# Patient Record
Sex: Female | Born: 1985 | Hispanic: Yes | Marital: Single | Smoking: Never smoker
Health system: Southern US, Community
[De-identification: ages and names within clinical notes are randomized; demographics above are authoritative.]

## PROBLEM LIST (undated history)

## (undated) DIAGNOSIS — Z8619 Personal history of other infectious and parasitic diseases: Secondary | ICD-10-CM

## (undated) HISTORY — DX: Personal history of other infectious and parasitic diseases: Z86.19

---

## 2011-12-09 ENCOUNTER — Other Ambulatory Visit: Payer: Self-pay

## 2011-12-09 LAB — OB RESULTS CONSOLE ANTIBODY SCREEN: Antibody Screen: NEGATIVE

## 2011-12-09 LAB — OB RESULTS CONSOLE GC/CHLAMYDIA
Chlamydia: NEGATIVE
Gonorrhea: NEGATIVE

## 2012-01-06 ENCOUNTER — Other Ambulatory Visit (HOSPITAL_COMMUNITY): Payer: Self-pay | Admitting: Physician Assistant

## 2012-01-06 DIAGNOSIS — Z3689 Encounter for other specified antenatal screening: Secondary | ICD-10-CM

## 2012-01-27 ENCOUNTER — Ambulatory Visit (HOSPITAL_COMMUNITY)
Admission: RE | Admit: 2012-01-27 | Discharge: 2012-01-27 | Disposition: A | Discharge status: Home / Self Care | Payer: Self-pay | Source: Ambulatory Visit | Attending: Physician Assistant | Admitting: Physician Assistant

## 2012-01-27 DIAGNOSIS — Z1389 Encounter for screening for other disorder: Secondary | ICD-10-CM | POA: Insufficient documentation

## 2012-01-27 DIAGNOSIS — O358XX0 Maternal care for other (suspected) fetal abnormality and damage, not applicable or unspecified: Secondary | ICD-10-CM | POA: Insufficient documentation

## 2012-01-27 DIAGNOSIS — Z363 Encounter for antenatal screening for malformations: Secondary | ICD-10-CM | POA: Insufficient documentation

## 2012-01-27 DIAGNOSIS — Z3689 Encounter for other specified antenatal screening: Secondary | ICD-10-CM

## 2012-02-03 ENCOUNTER — Other Ambulatory Visit (HOSPITAL_COMMUNITY): Payer: Self-pay | Admitting: Physician Assistant

## 2012-02-03 DIAGNOSIS — Z3689 Encounter for other specified antenatal screening: Secondary | ICD-10-CM

## 2012-02-04 ENCOUNTER — Ambulatory Visit (HOSPITAL_COMMUNITY)
Admission: RE | Admit: 2012-02-04 | Discharge: 2012-02-04 | Disposition: A | Discharge status: Home / Self Care | Payer: Self-pay | Source: Ambulatory Visit | Attending: Physician Assistant | Admitting: Physician Assistant

## 2012-02-04 ENCOUNTER — Encounter (HOSPITAL_COMMUNITY): Payer: Self-pay

## 2012-02-04 DIAGNOSIS — IMO0002 Reserved for concepts with insufficient information to code with codable children: Secondary | ICD-10-CM | POA: Insufficient documentation

## 2012-02-05 ENCOUNTER — Other Ambulatory Visit: Payer: Self-pay

## 2012-02-05 NOTE — Progress Notes (Signed)
Genetic Counseling  High-Risk Gestation Note  Appointment Date:  02/04/2012 Referred By: Levi Aland, PA Date of Birth:  15-Oct-1985    Pregnancy History: G2P0010 Estimated Date of Delivery: 06/29/2012 Estimated Gestational Age: [redacted]w[redacted]d Attending: Alpha Gula, MD    Ms. Gabriela Willis was seen for genetic counseling regarding an increased risk for Down syndrome based on Quad screen performed through [facility]. [employer] Interpreter, Byrd Hesselbach, provided Spanish/English interpretation during today's visit.   She was counseled regarding the Quad screen result and the associated 1 in 208 risk for fetal Down syndrome.  We reviewed chromosomes, nondisjunction, and the common features and variable prognosis of Down syndrome.  In addition, we reviewed the screen adjusted reduction in risks for trisomy 18 (1 in 1,379 to 1 in 8,334) and ONTDs.  We also discussed other explanations for a screen positive result including: a gestational dating error, differences in maternal metabolism, and normal variation. She understands that this screening is not diagnostic for Down syndrome but provides a risk assessment.  We reviewed other available screening options including noninvasive prenatal testing (NIPT) and detailed ultrasound.  Specifically, we discussed that NIPT analyzes cell free fetal DNA found in the maternal circulation. This test is not diagnostic for chromosome conditions, but can provide information regarding the presence or absence of extra fetal DNA for chromosomes 13, 18, 21, X, and Y, and missing fetal DNA for chromosome X and Y (Turner syndrome). Thus, it would not identify or rule out all genetic conditions. The reported detection rate is greater than 99% for Trisomy 21, greater than 98% for Trisomy 18, and is approximately 80% (8 out of 10) for Trisomy 13. The false positive rate is reported to be less than 0.1% for any of these conditions.  In addition, we  discussed that ~50-80% of fetuses with Down syndrome and up to 90-95% of fetuses with trisomy 18/13, when well visualized, have detectable anomalies or soft markers by detailed ultrasound (~18+ weeks gestation). Ms. Gabriela Willis previously had ultrasound performed through [facility] on 01/27/12. We reviewed that the visualized fetal anatomy was reported to be normal from the previous scan. However, fetal anatomy was suboptimally visualized. A follow-up ultrasound is planned in [facility] on 02/18/12.   Ms. Gabriela Willis was also counseled regarding diagnostic testing via amniocentesis.  We reviewed the approximate 1 in 300-500 risk for complications, including spontaneous pregnancy loss. After consideration of all the options, and a clear understanding of the newness and limitations of NIPT, she elected to proceed with cell free fetal DNA testing (MaterniT21) at the time of today's visit. Those results will be available in 8-10 days.  She declined amniocentesis in pregnancy given the associated risk of complications. Ms. Gabriela Willis stated that she wanted to pursue NIPT in order to better prepare during the pregnancy.   Ms. Gabriela Willis was provided with written information regarding sickle cell anemia (SCA) including the carrier frequency and incidence in the Hispanic population, the availability of carrier testing and prenatal diagnosis if indicated.  In addition, we discussed that hemoglobinopathies are routinely screened for as part of the [place] newborn screening panel.  She previously had a normal hemoglobinopathy profile performed through [facility], indicating that she does not likely carry sickle cell trait.    Both family histories were reviewed and found to be noncontributory for birth defects, mental retardation, and known genetic conditions. Without further information regarding the provided family history,  an accurate genetic risk cannot be calculated. Further genetic counseling is warranted if more information is obtained.  Ms. Gabriela Willis denied exposure to environmental toxins or chemical agents. She denied the use of alcohol, tobacco or street drugs. She denied significant viral illnesses during the course of her pregnancy. Her medical and surgical histories were noncontributory.     I counseled Ms. Gabriela Willis regarding the above risks and available options.  The approximate face-to-face time with the genetic counselor was 40 minutes.    Quinn Plowman, MS,  Certified Genetic Counselor 02/05/2012

## 2012-02-16 ENCOUNTER — Telehealth (HOSPITAL_COMMUNITY): Payer: Self-pay | Admitting: MS"

## 2012-02-16 NOTE — Telephone Encounter (Signed)
[employer], Interpreters [phone] provided interpretation during previous telephone call regarding cell free fetal DNA testing results.

## 2012-02-16 NOTE — Telephone Encounter (Signed)
Attempted to call patient regarding results of cell free fetal DNA testing (MaterniT21). Left message via [facility] [phone], calling with "good news." Asked patient to return call.   Clydie Braun Brylee Berk 02/16/2012 10:15 AM

## 2012-02-16 NOTE — Telephone Encounter (Signed)
Called Gabriela Willis to discuss her MaterniT21, cell free fetal DNA testing. Testing was offered because of screen positive Down syndrome risk. We reviewed that these are within normal limits/negative, for trisomies 21, 18 and 13. We reviewed that this testing identifies > 99% of pregnancies with trisomy 21, >98% of pregnancies with trisomy 13, and >80% with trisomy 54; the false positive rate is <0.1% for all conditions. Testing was also performed for X and Y chromosome analysis. This did not show evidence of aneuploidy for X or Y. It was also consistent with female gender. X and Y analysis has a detection rate of approximately 99%. She understands that this testing does not identify all genetic conditions. All questions were answered to her satisfaction, she was encouraged to call with additional questions or concerns.  Quinn Plowman, MS Certified Genetic Counselor 02/16/2012 3:05 PM

## 2012-02-18 ENCOUNTER — Other Ambulatory Visit (HOSPITAL_COMMUNITY): Payer: Self-pay

## 2012-02-18 ENCOUNTER — Encounter (HOSPITAL_COMMUNITY): Payer: Self-pay

## 2012-02-18 ENCOUNTER — Ambulatory Visit (HOSPITAL_COMMUNITY)
Admission: RE | Admit: 2012-02-18 | Discharge: 2012-02-18 | Disposition: A | Discharge status: Home / Self Care | Payer: Self-pay | Source: Ambulatory Visit | Attending: Physician Assistant | Admitting: Physician Assistant

## 2012-02-18 DIAGNOSIS — Z3689 Encounter for other specified antenatal screening: Secondary | ICD-10-CM | POA: Insufficient documentation

## 2012-02-18 NOTE — Progress Notes (Signed)
Gabriela Willis  was seen today for an ultrasound appointment.  See full report in [facility].  Impression: Single IUP at 21 1/7 weeks Normal detailed fetal anatomy No markers associated with aneuploidy noted Normal amniotic fluid volume  NIPT (cell free fetal DNA) - low risk for aneuploidy  Recommendations: Follow-up ultrasounds as clinically indicated.   Alpha Gula, MD

## 2012-06-30 ENCOUNTER — Other Ambulatory Visit (HOSPITAL_COMMUNITY): Payer: Self-pay | Admitting: Nurse Practitioner

## 2012-06-30 ENCOUNTER — Telehealth (HOSPITAL_COMMUNITY): Payer: Self-pay | Admitting: *Deleted

## 2012-06-30 ENCOUNTER — Encounter (HOSPITAL_COMMUNITY): Payer: Self-pay | Admitting: *Deleted

## 2012-06-30 DIAGNOSIS — O480 Post-term pregnancy: Secondary | ICD-10-CM

## 2012-06-30 NOTE — Telephone Encounter (Signed)
Preadmission screen Interpreter number [phone]

## 2012-07-01 ENCOUNTER — Encounter (HOSPITAL_COMMUNITY): Payer: Self-pay

## 2012-07-01 ENCOUNTER — Inpatient Hospital Stay (HOSPITAL_COMMUNITY)
Admission: AD | Admit: 2012-07-01 | Discharge: 2012-07-01 | Disposition: A | Discharge status: Home / Self Care | Payer: Self-pay | Source: Ambulatory Visit | Attending: Family Medicine | Admitting: Family Medicine

## 2012-07-01 DIAGNOSIS — O479 False labor, unspecified: Secondary | ICD-10-CM | POA: Insufficient documentation

## 2012-07-01 DIAGNOSIS — O99891 Other specified diseases and conditions complicating pregnancy: Secondary | ICD-10-CM | POA: Insufficient documentation

## 2012-07-01 NOTE — MAU Provider Note (Signed)
Chart reviewed and agree with management and plan.  

## 2012-07-01 NOTE — MAU Note (Signed)
Water broke at 4:25am clear fluid. Contractions every 2 minutes. Cervix was 1 cm yesterday.

## 2012-07-01 NOTE — MAU Provider Note (Signed)
First Provider Initiated Contact with Patient 07/01/12 0543      Chief Complaint:  Rupture of Membranes   Gabriela Willis is  27 y.o. G2P0010 at [redacted]w[redacted]d presents complaining of Rupture of Membranes .She had some leaking around 0445.  She has been having contractions since yesterday, now getting closer together  She states irregular, every 3-5 minutes contractions are associated with none vaginal bleeding, {along with active fetal movement.   Obstetrical/Gynecological History: Menstrual History: OB History   Grav Para Term Preterm Abortions TAB SAB Ect Mult Living   2 0 0 0 1 0 1 0 0 0        Patient's last menstrual period was 10/01/2011.     Past Medical History: Past Medical History  Diagnosis Date  . Hx of varicella     Past Surgical History: History reviewed. No pertinent past surgical history.  Family History: Family History  Problem Relation Age of Onset  . Asthma Brother     Social History: History  Substance Use Topics  . Smoking status: Not on file  . Smokeless tobacco: Not on file  . Alcohol Use: Not on file    Allergies:  Allergies  Allergen Reactions  . Penicillins     Meds:  No prescriptions prior to admission    Review of Systems   Constitutional: Negative for fever and chills Eyes: Negative for visual disturbances Respiratory: Negative for shortness of breath, dyspnea Cardiovascular: Negative for chest pain or palpitations  Gastrointestinal: Negative for vomiting, diarrhea and constipation Genitourinary: Negative for dysuria and urgency Musculoskeletal: Negative for back pain, joint pain, myalgias  Neurological: Negative for dizziness and headaches     Physical Exam  Blood pressure 113/73, pulse 74, temperature 98.5 F (36.9 C), temperature source Oral, resp. rate 18, height 4\' 10"  (1.473 m), weight 150 lb (68.04 kg), last menstrual period 10/01/2011, SpO2 100.00%. GENERAL: Well-developed, well-nourished female in no acute  distress.  LUNGS: Clear to auscultation bilaterally.  HEART: Regular rate and rhythm. ABDOMEN: Soft, nontender, nondistended, gravid.  EXTREMITIES: Nontender, no edema, 2+ distal pulses. CERVICAL EXAM: Dilatation 0.5cm   Effacement 90%   Station -1.  Pt states her cx was 1 cm yesterday   Presentation: cephalic FHT:  Baseline rate 150 bpm   Variability moderate  Accelerations present   Decelerations none Contractions: Every 3-5 mins, mild   Labs: Results for orders placed during the hospital encounter of 07/01/12 (from the past 24 hour(s))  WET PREP, GENITAL   Collection Time    07/01/12  5:44 AM      Result Value Range   Yeast Wet Prep HPF POC NONE SEEN  NONE SEEN   Trich, Wet Prep NONE SEEN  NONE SEEN   Clue Cells Wet Prep HPF POC NONE SEEN  NONE SEEN   WBC, Wet Prep HPF POC MODERATE (*) NONE SEEN  AMNISURE RUPTURE OF MEMBRANE (ROM)   Collection Time    07/01/12  5:44 AM      Result Value Range   Amnisure ROM NEGATIVE     Imaging Studies:  No results found.  Assessment: Gabriela Willis is  27 y.o. G2P0010 at [redacted]w[redacted]d presents with no ROM, possibly early labor.  Plan: Instructed to come back if/when contractions increase in strength  CRESENZO-DISHMAN,Zamarah Ullmer 6/13/20146:49 AM

## 2012-07-02 ENCOUNTER — Inpatient Hospital Stay (HOSPITAL_COMMUNITY)
Admission: AD | Admit: 2012-07-02 | Discharge: 2012-07-05 | DRG: 765 | Disposition: A | Discharge status: Home / Self Care | Payer: Medicaid Other | Source: Ambulatory Visit | Attending: Obstetrics and Gynecology | Admitting: Obstetrics and Gynecology

## 2012-07-02 ENCOUNTER — Encounter (HOSPITAL_COMMUNITY): Payer: Self-pay | Admitting: *Deleted

## 2012-07-02 ENCOUNTER — Inpatient Hospital Stay (HOSPITAL_COMMUNITY): Payer: Medicaid Other | Admitting: Anesthesiology

## 2012-07-02 ENCOUNTER — Encounter (HOSPITAL_COMMUNITY): Payer: Self-pay | Admitting: Anesthesiology

## 2012-07-02 DIAGNOSIS — O411090 Infection of amniotic sac and membranes, unspecified, unspecified trimester, not applicable or unspecified: Secondary | ICD-10-CM | POA: Diagnosis present

## 2012-07-02 DIAGNOSIS — O4290 Premature rupture of membranes, unspecified as to length of time between rupture and onset of labor, unspecified weeks of gestation: Principal | ICD-10-CM | POA: Diagnosis present

## 2012-07-02 DIAGNOSIS — Z98891 History of uterine scar from previous surgery: Secondary | ICD-10-CM

## 2012-07-02 LAB — CBC
HCT: 41.4 % (ref 36.0–46.0)
MCHC: 34.5 g/dL (ref 30.0–36.0)
Platelets: 179 10*3/uL (ref 150–400)
RDW: 15 % (ref 11.5–15.5)

## 2012-07-02 LAB — TYPE AND SCREEN: Antibody Screen: NEGATIVE

## 2012-07-02 MED ORDER — EPHEDRINE 5 MG/ML INJ
10.0000 mg | INTRAVENOUS | Status: DC | PRN
  Filled 2012-07-02: qty 4

## 2012-07-02 MED ORDER — PHENYLEPHRINE 40 MCG/ML (10ML) SYRINGE FOR IV PUSH (FOR BLOOD PRESSURE SUPPORT)
80.0000 ug | PREFILLED_SYRINGE | INTRAVENOUS | Status: DC | PRN
  Filled 2012-07-02: qty 5

## 2012-07-02 MED ORDER — PHENYLEPHRINE 40 MCG/ML (10ML) SYRINGE FOR IV PUSH (FOR BLOOD PRESSURE SUPPORT)
80.0000 ug | PREFILLED_SYRINGE | INTRAVENOUS | Status: DC | PRN
  Administered 2012-07-03 (×2): 80 ug via INTRAVENOUS

## 2012-07-02 MED ORDER — OXYTOCIN 40 UNITS IN LACTATED RINGERS INFUSION - SIMPLE MED
62.5000 mL/h | INTRAVENOUS | Status: DC
  Filled 2012-07-02: qty 1000

## 2012-07-02 MED ORDER — LACTATED RINGERS IV SOLN
500.0000 mL | Freq: Once | INTRAVENOUS | Status: AC
  Administered 2012-07-02: 500 mL via INTRAVENOUS

## 2012-07-02 MED ORDER — LACTATED RINGERS IV SOLN
INTRAVENOUS | Status: DC
  Administered 2012-07-02 – 2012-07-03 (×8): via INTRAVENOUS

## 2012-07-02 MED ORDER — FENTANYL 2.5 MCG/ML BUPIVACAINE 1/10 % EPIDURAL INFUSION (WH - ANES)
14.0000 mL/h | INTRAMUSCULAR | Status: DC | PRN
  Administered 2012-07-02: 12 mL/h via EPIDURAL
  Administered 2012-07-03: 14 mL/h via EPIDURAL
  Filled 2012-07-02 (×2): qty 125

## 2012-07-02 MED ORDER — OXYTOCIN 40 UNITS IN LACTATED RINGERS INFUSION - SIMPLE MED
1.0000 m[IU]/min | INTRAVENOUS | Status: DC
  Administered 2012-07-02: 2 m[IU]/min via INTRAVENOUS

## 2012-07-02 MED ORDER — OXYTOCIN BOLUS FROM INFUSION: 500.0000 mL | INTRAVENOUS | Status: DC

## 2012-07-02 MED ORDER — OXYCODONE-ACETAMINOPHEN 5-325 MG PO TABS: 1.0000 | ORAL_TABLET | ORAL | Status: DC | PRN

## 2012-07-02 MED ORDER — DIPHENHYDRAMINE HCL 50 MG/ML IJ SOLN: 12.5000 mg | INTRAMUSCULAR | Status: DC | PRN

## 2012-07-02 MED ORDER — CITRIC ACID-SODIUM CITRATE 334-500 MG/5ML PO SOLN
30.0000 mL | ORAL | Status: DC | PRN
  Administered 2012-07-03: 30 mL via ORAL
  Filled 2012-07-02: qty 15

## 2012-07-02 MED ORDER — TERBUTALINE SULFATE 1 MG/ML IJ SOLN
0.2500 mg | Freq: Once | INTRAMUSCULAR | Status: AC | PRN
  Filled 2012-07-02: qty 1

## 2012-07-02 MED ORDER — FLEET ENEMA 7-19 GM/118ML RE ENEM: 1.0000 | ENEMA | RECTAL | Status: DC | PRN

## 2012-07-02 MED ORDER — ONDANSETRON HCL 4 MG/2ML IJ SOLN: 4.0000 mg | Freq: Four times a day (QID) | INTRAMUSCULAR | Status: DC | PRN

## 2012-07-02 MED ORDER — LIDOCAINE HCL (PF) 1 % IJ SOLN
INTRAMUSCULAR | Status: DC | PRN
  Administered 2012-07-02 (×3): 4 mL

## 2012-07-02 MED ORDER — LIDOCAINE HCL (PF) 1 % IJ SOLN
30.0000 mL | INTRAMUSCULAR | Status: DC | PRN
  Filled 2012-07-02: qty 30

## 2012-07-02 MED ORDER — LACTATED RINGERS IV SOLN
500.0000 mL | INTRAVENOUS | Status: DC | PRN
  Administered 2012-07-03 (×2): 500 mL via INTRAVENOUS

## 2012-07-02 MED ORDER — EPHEDRINE 5 MG/ML INJ: 10.0000 mg | INTRAVENOUS | Status: DC | PRN

## 2012-07-02 MED ORDER — IBUPROFEN 600 MG PO TABS: 600.0000 mg | ORAL_TABLET | Freq: Four times a day (QID) | ORAL | Status: DC | PRN

## 2012-07-02 MED ORDER — ACETAMINOPHEN 325 MG PO TABS: 650.0000 mg | ORAL_TABLET | ORAL | Status: DC | PRN

## 2012-07-02 NOTE — H&P (Signed)
Gabriela Willis is a 27 y.o.G2P0010  female at [redacted]w[redacted]d by 11wk u/s, who was initially a labor check that was being evaluated on L&D, after 2 SVE w/o any cervical change, I came to evaluate pt and she then reported lof since 1200 today.  Small amount of fluid on inner thigh, fern indeterminate, amnisure pos. Reports worseing uc's and lof since yest am- she was evaluated in MAU and had neg amnisure and was d/c'd. Good fm. Denies vb. Initiated pnc at [facility] at 11wks. AFP increased DSR 1:208 w/ negative Materni T21, 1hr glucola 88, normal anatomy u/s, gbs neg.   Maternal Medical History:  Reason for admission: Rupture of membranes and contractions.   Contractions: Onset was 13-24 hours ago.   Frequency: regular.   Perceived severity is moderate.    Fetal activity: Perceived fetal activity is normal.   Last perceived fetal movement was within the past hour.    Prenatal complications: no prenatal complications Prenatal Complications - Diabetes: none.    OB History   Grav Para Term Preterm Abortions TAB SAB Ect Mult Living   2 0 0 0 1 0 1 0 0 0      Past Medical History  Diagnosis Date  . Hx of varicella    History reviewed. No pertinent past surgical history. Family History: family history includes Asthma in her brother. Social History:  does not have a smoking history on file. She has never used smokeless tobacco. She reports that she does not drink alcohol or use illicit drugs.  Review of Systems  Constitutional: Negative.   HENT: Negative.   Eyes: Negative.   Respiratory: Negative.   Cardiovascular: Negative.   Gastrointestinal: Positive for abdominal pain (uc's).  Genitourinary: Negative.   Musculoskeletal: Negative.   Skin: Negative.   Neurological: Negative.   Endo/Heme/Allergies: Negative.   Psychiatric/Behavioral: Negative.     Dilation: 1 Effacement (%): 90 Station: -1 Exam by:: k. Saydie Gerdts, cnm Blood pressure 121/68, pulse 83, temperature 98 F (36.7 C),  temperature source Oral, resp. rate 18, height 4\' 10"  (1.473 m), weight 68.04 kg (150 lb), last menstrual period 10/01/2011. Maternal Exam:  Uterine Assessment: Contraction strength is moderate.  Contraction frequency is regular.   Abdomen: Patient reports no abdominal tenderness. Fetal presentation: vertex  Introitus: Normal vulva. Normal vagina.  Ferning test: positive.  Amniotic fluid character: clear.  Pelvis: adequate for delivery.   Cervix: Cervix evaluated by digital exam.     Fetal Exam Fetal Monitor Review: Mode: ultrasound.   Baseline rate: 140.  Variability: moderate (6-25 bpm).   Pattern: accelerations present and no decelerations.    Fetal State Assessment: Category I - tracings are normal.     Physical Exam  Constitutional: She is oriented to person, place, and time. She appears well-developed and well-nourished.  HENT:  Head: Normocephalic.  Neck: Normal range of motion.  Cardiovascular: Normal rate and regular rhythm.   Respiratory: Effort normal and breath sounds normal.  GI: Soft.  gravid  Genitourinary: Vagina normal and uterus normal.  Small amount of fluid on inner thigh- fern indeterminate Amnisure pos  SVE: 1/90/-1, vtx  Musculoskeletal: Normal range of motion.  Neurological: She is alert and oriented to person, place, and time. She has normal reflexes.  Skin: Skin is warm and dry.  Psychiatric: She has a normal mood and affect. Her behavior is normal. Judgment and thought content normal.    2037: Cervical foley bulb inserted and inflated w/ 60ml LR w/o difficulty, pt has forebag, will arom  if needed after foley bulb falls out  Prenatal labs: ABO, Rh: O/Positive/-- (11/20 0000) Antibody: Negative (11/20 0000) Rubella: Immune (11/20 0000) RPR: Nonreactive (11/20 0000)  HBsAg: Negative (11/20 0000)  HIV: Non-reactive (11/20 0000)  GBS: Negative (05/17 0000)   Assessment/Plan: A:  109w3d SIUP  G2P0010   Cat I FHR  GBS neg  SROM @ 1200 w/  early labor  AFP increased DSR w/ neg Materni T21  P:  Admit to BS  IV pain meds/epidural prn  Cervical foley bulb in place, pitocin per protocol once it falls out  Anticipate NSVD  Marge Duncans 07/02/2012, 7:52 PM

## 2012-07-02 NOTE — Anesthesia Preprocedure Evaluation (Addendum)
Anesthesia Evaluation  Patient identified by MRN, date of birth, ID band Patient awake    Reviewed: Allergy & Precautions, H&P , NPO status , Patient's Chart, lab work & pertinent test results, reviewed documented beta blocker date and time   History of Anesthesia Complications Negative for: history of anesthetic complications  Airway Mallampati: III TM Distance: >3 FB Neck ROM: full    Dental  (+) Teeth Intact   Pulmonary neg pulmonary ROS,  breath sounds clear to auscultation        Cardiovascular negative cardio ROS  Rhythm:regular Rate:Normal     Neuro/Psych negative neurological ROS  negative psych ROS   GI/Hepatic negative GI ROS, Neg liver ROS,   Endo/Other  negative endocrine ROS  Renal/GU negative Renal ROS     Musculoskeletal   Abdominal   Peds  Hematology negative hematology ROS (+)   Anesthesia Other Findings NOTE pt is 4'10" tall  Reproductive/Obstetrics (+) Pregnancy                          Anesthesia Physical Anesthesia Plan  ASA: II  Anesthesia Plan: Epidural   Post-op Pain Management:    Induction:   Airway Management Planned:   Additional Equipment:   Intra-op Plan:   Post-operative Plan:   Informed Consent: I have reviewed the patients History and Physical, chart, labs and discussed the procedure including the risks, benefits and alternatives for the proposed anesthesia with the patient or authorized representative who has indicated his/her understanding and acceptance.     Plan Discussed with:   Anesthesia Plan Comments:         Anesthesia Quick Evaluation

## 2012-07-02 NOTE — Anesthesia Procedure Notes (Signed)
Epidural Patient location during procedure: OB Start time: 07/02/2012 10:35 PM  Staffing Performed by: anesthesiologist   Preanesthetic Checklist Completed: patient identified, site marked, surgical consent, pre-op evaluation, timeout performed, IV checked, risks and benefits discussed and monitors and equipment checked  Epidural Patient position: sitting Prep: site prepped and draped and DuraPrep Patient monitoring: continuous pulse ox and blood pressure Approach: midline Injection technique: LOR air  Needle:  Needle type: Tuohy  Needle gauge: 17 G Needle length: 9 cm and 9 Needle insertion depth: 4 cm Catheter type: closed end flexible Catheter size: 19 Gauge Catheter at skin depth: 9 cm Test dose: negative  Assessment Events: blood not aspirated, injection not painful, no injection resistance, negative IV test and no paresthesia  Additional Notes Discussed risk of headache, infection, bleeding, nerve injury and failed or incomplete block.  Patient voices understanding and wishes to proceed.  Epidural placed easily on first attempt.  No paresthesia.  Patient tolerated procedure well with no apparent complications.  Jasmine December, MDReason for block:procedure for pain

## 2012-07-02 NOTE — Progress Notes (Signed)
Gabriela Willis is a 27 y.o. G2P0010 at [redacted]w[redacted]d admitted for PROM  Subjective: Pt has just received epidural, now comfortable  Objective: BP 97/51  Pulse 86  Temp(Src) 98.7 F (37.1 C) (Oral)  Resp 18  Ht 4\' 10"  (1.473 m)  Wt 68.04 kg (150 lb)  BMI 31.36 kg/m2  SpO2 100%  LMP 10/01/2011      FHT:  FHR: 150 bpm, variability: moderate,  accelerations:  Present,  decelerations:  Absent UC:   regular, every 2-5 minutes SVE:   Dilation: 5.5 Effacement (%): 90 Station: -1 Exam by:: Lilli Few, RN Foley bulb fell out  Labs: Lab Results  Component Value Date   WBC 13.1* 07/02/2012   HGB 14.3 07/02/2012   HCT 41.4 07/02/2012   MCV 87.0 07/02/2012   PLT 179 07/02/2012    Assessment / Plan: S/P foley bulb, will begin pitocin per protocol to achieve adequate labor/dilation  Labor: not yet Preeclampsia:  n/a Fetal Wellbeing:  Category I Pain Control:  Epidural I/D:  n/a Anticipated MOD:  NSVD  Marge Duncans 07/02/2012, 11:27 PM

## 2012-07-03 ENCOUNTER — Encounter (HOSPITAL_COMMUNITY): Payer: Self-pay | Admitting: *Deleted

## 2012-07-03 ENCOUNTER — Encounter (HOSPITAL_COMMUNITY): Payer: Self-pay | Admitting: Certified Registered"

## 2012-07-03 ENCOUNTER — Encounter (HOSPITAL_COMMUNITY)
Admission: AD | Disposition: A | Discharge status: Home / Self Care | Payer: Self-pay | Source: Ambulatory Visit | Attending: Obstetrics and Gynecology

## 2012-07-03 ENCOUNTER — Inpatient Hospital Stay (HOSPITAL_COMMUNITY): Payer: Medicaid Other | Admitting: Certified Registered"

## 2012-07-03 DIAGNOSIS — Z98891 History of uterine scar from previous surgery: Secondary | ICD-10-CM

## 2012-07-03 DIAGNOSIS — O4290 Premature rupture of membranes, unspecified as to length of time between rupture and onset of labor, unspecified weeks of gestation: Secondary | ICD-10-CM

## 2012-07-03 DIAGNOSIS — O411090 Infection of amniotic sac and membranes, unspecified, unspecified trimester, not applicable or unspecified: Secondary | ICD-10-CM

## 2012-07-03 SURGERY — Surgical Case
Anesthesia: Epidural | Site: Abdomen | Wound class: Clean Contaminated

## 2012-07-03 MED ORDER — LANOLIN HYDROUS EX OINT: 1.0000 "application " | TOPICAL_OINTMENT | CUTANEOUS | Status: DC | PRN

## 2012-07-03 MED ORDER — SODIUM BICARBONATE 8.4 % IV SOLN
INTRAVENOUS | Status: DC | PRN
  Administered 2012-07-03: 4 mL via EPIDURAL

## 2012-07-03 MED ORDER — DIBUCAINE 1 % RE OINT: 1.0000 "application " | TOPICAL_OINTMENT | RECTAL | Status: DC | PRN

## 2012-07-03 MED ORDER — 0.9 % SODIUM CHLORIDE (POUR BTL) OPTIME
TOPICAL | Status: DC | PRN
  Administered 2012-07-03: 1000 mL

## 2012-07-03 MED ORDER — OXYTOCIN 10 UNIT/ML IJ SOLN
INTRAMUSCULAR | Status: AC
  Filled 2012-07-03: qty 5

## 2012-07-03 MED ORDER — LIDOCAINE-EPINEPHRINE (PF) 2 %-1:200000 IJ SOLN
INTRAMUSCULAR | Status: AC
  Filled 2012-07-03: qty 20

## 2012-07-03 MED ORDER — DIPHENHYDRAMINE HCL 25 MG PO CAPS: 25.0000 mg | ORAL_CAPSULE | Freq: Four times a day (QID) | ORAL | Status: DC | PRN

## 2012-07-03 MED ORDER — SODIUM CHLORIDE 0.9 % IJ SOLN: 3.0000 mL | INTRAMUSCULAR | Status: DC | PRN

## 2012-07-03 MED ORDER — OXYTOCIN 10 UNIT/ML IJ SOLN
40.0000 [IU] | INTRAVENOUS | Status: DC | PRN
  Administered 2012-07-03: 40 [IU] via INTRAVENOUS

## 2012-07-03 MED ORDER — ZOLPIDEM TARTRATE 5 MG PO TABS: 5.0000 mg | ORAL_TABLET | Freq: Every evening | ORAL | Status: DC | PRN

## 2012-07-03 MED ORDER — ONDANSETRON HCL 4 MG/2ML IJ SOLN
INTRAMUSCULAR | Status: DC | PRN
  Administered 2012-07-03: 4 mg via INTRAVENOUS

## 2012-07-03 MED ORDER — MEPERIDINE HCL 25 MG/ML IJ SOLN
INTRAMUSCULAR | Status: AC
  Filled 2012-07-03: qty 1

## 2012-07-03 MED ORDER — PRENATAL MULTIVITAMIN CH
1.0000 | ORAL_TABLET | Freq: Every day | ORAL | Status: DC
  Administered 2012-07-04 – 2012-07-05 (×2): 1 via ORAL
  Filled 2012-07-03 (×2): qty 1

## 2012-07-03 MED ORDER — TETANUS-DIPHTH-ACELL PERTUSSIS 5-2.5-18.5 LF-MCG/0.5 IM SUSP
0.5000 mL | Freq: Once | INTRAMUSCULAR | Status: AC
  Administered 2012-07-04: 0.5 mL via INTRAMUSCULAR
  Filled 2012-07-03: qty 0.5

## 2012-07-03 MED ORDER — ONDANSETRON HCL 4 MG/2ML IJ SOLN
INTRAMUSCULAR | Status: AC
  Filled 2012-07-03: qty 2

## 2012-07-03 MED ORDER — MEPERIDINE HCL 25 MG/ML IJ SOLN: 6.2500 mg | INTRAMUSCULAR | Status: DC | PRN

## 2012-07-03 MED ORDER — GENTAMICIN SULFATE 40 MG/ML IJ SOLN
140.0000 mg | Freq: Once | INTRAVENOUS | Status: AC
  Administered 2012-07-03: 140 mg via INTRAVENOUS
  Filled 2012-07-03: qty 3.5

## 2012-07-03 MED ORDER — TERBUTALINE SULFATE 1 MG/ML IJ SOLN
0.2500 mg | Freq: Once | INTRAMUSCULAR | Status: AC
  Administered 2012-07-03: 0.25 mg via SUBCUTANEOUS

## 2012-07-03 MED ORDER — KETOROLAC TROMETHAMINE 30 MG/ML IJ SOLN
INTRAMUSCULAR | Status: AC
  Administered 2012-07-03: 30 mg via INTRAVENOUS
  Filled 2012-07-03: qty 1

## 2012-07-03 MED ORDER — NALOXONE HCL 0.4 MG/ML IJ SOLN: 0.4000 mg | INTRAMUSCULAR | Status: DC | PRN

## 2012-07-03 MED ORDER — SENNOSIDES-DOCUSATE SODIUM 8.6-50 MG PO TABS
2.0000 | ORAL_TABLET | Freq: Every day | ORAL | Status: DC
  Administered 2012-07-03 – 2012-07-04 (×2): 2 via ORAL

## 2012-07-03 MED ORDER — SCOPOLAMINE 1 MG/3DAYS TD PT72
MEDICATED_PATCH | TRANSDERMAL | Status: AC
  Filled 2012-07-03: qty 1

## 2012-07-03 MED ORDER — MAGNESIUM HYDROXIDE 400 MG/5ML PO SUSP: 30.0000 mL | ORAL | Status: DC | PRN

## 2012-07-03 MED ORDER — DEXTROSE 5 % IV SOLN
140.0000 mg | Freq: Three times a day (TID) | INTRAVENOUS | Status: AC
  Administered 2012-07-03 – 2012-07-04 (×3): 140 mg via INTRAVENOUS
  Filled 2012-07-03 (×3): qty 3.5

## 2012-07-03 MED ORDER — KETOROLAC TROMETHAMINE 30 MG/ML IJ SOLN
30.0000 mg | Freq: Four times a day (QID) | INTRAMUSCULAR | Status: DC | PRN
  Administered 2012-07-03: 30 mg via INTRAVENOUS
  Filled 2012-07-03: qty 1

## 2012-07-03 MED ORDER — GENTAMICIN SULFATE 40 MG/ML IJ SOLN
120.0000 mg | Freq: Three times a day (TID) | INTRAVENOUS | Status: DC
  Filled 2012-07-03: qty 3

## 2012-07-03 MED ORDER — MORPHINE SULFATE 0.5 MG/ML IJ SOLN
INTRAMUSCULAR | Status: AC
  Filled 2012-07-03: qty 10

## 2012-07-03 MED ORDER — IBUPROFEN 600 MG PO TABS
600.0000 mg | ORAL_TABLET | Freq: Four times a day (QID) | ORAL | Status: DC
  Administered 2012-07-03 – 2012-07-05 (×7): 600 mg via ORAL
  Filled 2012-07-03 (×7): qty 1

## 2012-07-03 MED ORDER — METOCLOPRAMIDE HCL 5 MG/ML IJ SOLN: 10.0000 mg | Freq: Three times a day (TID) | INTRAMUSCULAR | Status: DC | PRN

## 2012-07-03 MED ORDER — NALBUPHINE HCL 10 MG/ML IJ SOLN
5.0000 mg | INTRAMUSCULAR | Status: DC | PRN
  Filled 2012-07-03: qty 1

## 2012-07-03 MED ORDER — MORPHINE SULFATE (PF) 0.5 MG/ML IJ SOLN
INTRAMUSCULAR | Status: DC | PRN
  Administered 2012-07-03: 3 mg via EPIDURAL

## 2012-07-03 MED ORDER — KETOROLAC TROMETHAMINE 30 MG/ML IJ SOLN: 30.0000 mg | Freq: Four times a day (QID) | INTRAMUSCULAR | Status: DC | PRN

## 2012-07-03 MED ORDER — ONDANSETRON HCL 4 MG/2ML IJ SOLN: 4.0000 mg | Freq: Three times a day (TID) | INTRAMUSCULAR | Status: DC | PRN

## 2012-07-03 MED ORDER — BUPIVACAINE HCL (PF) 0.5 % IJ SOLN
INTRAMUSCULAR | Status: DC | PRN
  Administered 2012-07-03: 30 mL

## 2012-07-03 MED ORDER — SODIUM BICARBONATE 8.4 % IV SOLN
INTRAVENOUS | Status: AC
  Filled 2012-07-03: qty 50

## 2012-07-03 MED ORDER — BUPIVACAINE HCL (PF) 0.5 % IJ SOLN
INTRAMUSCULAR | Status: AC
  Filled 2012-07-03: qty 30

## 2012-07-03 MED ORDER — VANCOMYCIN HCL IN DEXTROSE 1-5 GM/200ML-% IV SOLN
1000.0000 mg | Freq: Two times a day (BID) | INTRAVENOUS | Status: DC
  Administered 2012-07-03: 1000 mg via INTRAVENOUS
  Filled 2012-07-03 (×2): qty 200

## 2012-07-03 MED ORDER — ACETAMINOPHEN 500 MG PO TABS
1000.0000 mg | ORAL_TABLET | Freq: Four times a day (QID) | ORAL | Status: DC | PRN
  Administered 2012-07-03: 1000 mg via ORAL
  Filled 2012-07-03: qty 2

## 2012-07-03 MED ORDER — DIPHENHYDRAMINE HCL 25 MG PO CAPS: 25.0000 mg | ORAL_CAPSULE | ORAL | Status: DC | PRN

## 2012-07-03 MED ORDER — MENTHOL 3 MG MT LOZG: 1.0000 | LOZENGE | OROMUCOSAL | Status: DC | PRN

## 2012-07-03 MED ORDER — OXYTOCIN 40 UNITS IN LACTATED RINGERS INFUSION - SIMPLE MED: 125.0000 mL/h | INTRAVENOUS | Status: AC

## 2012-07-03 MED ORDER — PHENYLEPHRINE 40 MCG/ML (10ML) SYRINGE FOR IV PUSH (FOR BLOOD PRESSURE SUPPORT)
PREFILLED_SYRINGE | INTRAVENOUS | Status: AC
  Filled 2012-07-03: qty 5

## 2012-07-03 MED ORDER — NALOXONE HCL 1 MG/ML IJ SOLN
1.0000 ug/kg/h | INTRAVENOUS | Status: DC | PRN
  Filled 2012-07-03: qty 2

## 2012-07-03 MED ORDER — OXYCODONE-ACETAMINOPHEN 5-325 MG PO TABS
1.0000 | ORAL_TABLET | ORAL | Status: DC | PRN
  Administered 2012-07-04 (×2): 1 via ORAL
  Filled 2012-07-03 (×2): qty 1

## 2012-07-03 MED ORDER — DIPHENHYDRAMINE HCL 50 MG/ML IJ SOLN: 25.0000 mg | INTRAMUSCULAR | Status: DC | PRN

## 2012-07-03 MED ORDER — MEPERIDINE HCL 25 MG/ML IJ SOLN
INTRAMUSCULAR | Status: DC | PRN
  Administered 2012-07-03 (×2): 12.5 mg via INTRAVENOUS

## 2012-07-03 MED ORDER — VANCOMYCIN HCL IN DEXTROSE 1-5 GM/200ML-% IV SOLN
1000.0000 mg | Freq: Two times a day (BID) | INTRAVENOUS | Status: AC
  Administered 2012-07-03 – 2012-07-04 (×2): 1000 mg via INTRAVENOUS
  Filled 2012-07-03 (×2): qty 200

## 2012-07-03 MED ORDER — FENTANYL CITRATE 0.05 MG/ML IJ SOLN: 100.0000 ug | INTRAMUSCULAR | Status: DC | PRN

## 2012-07-03 MED ORDER — ONDANSETRON HCL 4 MG/2ML IJ SOLN
4.0000 mg | INTRAMUSCULAR | Status: DC | PRN
  Administered 2012-07-03: 4 mg via INTRAVENOUS
  Filled 2012-07-03: qty 2

## 2012-07-03 MED ORDER — SIMETHICONE 80 MG PO CHEW
80.0000 mg | CHEWABLE_TABLET | ORAL | Status: DC | PRN
  Administered 2012-07-04: 80 mg via ORAL

## 2012-07-03 MED ORDER — SODIUM BICARBONATE 8.4 % IV SOLN
INTRAVENOUS | Status: DC | PRN
  Administered 2012-07-03: 2 mL via EPIDURAL

## 2012-07-03 MED ORDER — WITCH HAZEL-GLYCERIN EX PADS: 1.0000 "application " | MEDICATED_PAD | CUTANEOUS | Status: DC | PRN

## 2012-07-03 MED ORDER — SCOPOLAMINE 1 MG/3DAYS TD PT72
1.0000 | MEDICATED_PATCH | Freq: Once | TRANSDERMAL | Status: DC
  Administered 2012-07-03: 1.5 mg via TRANSDERMAL

## 2012-07-03 MED ORDER — DIPHENHYDRAMINE HCL 50 MG/ML IJ SOLN: 12.5000 mg | INTRAMUSCULAR | Status: DC | PRN

## 2012-07-03 MED ORDER — ONDANSETRON HCL 4 MG PO TABS: 4.0000 mg | ORAL_TABLET | ORAL | Status: DC | PRN

## 2012-07-03 MED ORDER — FENTANYL CITRATE 0.05 MG/ML IJ SOLN: 25.0000 ug | INTRAMUSCULAR | Status: DC | PRN

## 2012-07-03 SURGICAL SUPPLY — 31 items
CLAMP CORD UMBIL (MISCELLANEOUS) ×2 IMPLANT
CLOTH BEACON ORANGE TIMEOUT ST (SAFETY) ×2 IMPLANT
DRAPE LG THREE QUARTER DISP (DRAPES) ×2 IMPLANT
DRSG OPSITE POSTOP 4X10 (GAUZE/BANDAGES/DRESSINGS) ×2 IMPLANT
DURAPREP 26ML APPLICATOR (WOUND CARE) ×2 IMPLANT
ELECT REM PT RETURN 9FT ADLT (ELECTROSURGICAL) ×2
ELECTRODE REM PT RTRN 9FT ADLT (ELECTROSURGICAL) ×1 IMPLANT
EXTRACTOR VACUUM M CUP 4 TUBE (SUCTIONS) IMPLANT
GLOVE BIO SURGEON STRL SZ7 (GLOVE) ×2 IMPLANT
GLOVE BIOGEL PI IND STRL 7.0 (GLOVE) ×1 IMPLANT
GLOVE BIOGEL PI INDICATOR 7.0 (GLOVE) ×1
GOWN STRL REIN XL XLG (GOWN DISPOSABLE) ×4 IMPLANT
KIT ABG SYR 3ML LUER SLIP (SYRINGE) ×2 IMPLANT
NEEDLE HYPO 22GX1.5 SAFETY (NEEDLE) ×2 IMPLANT
NEEDLE HYPO 25X5/8 SAFETYGLIDE (NEEDLE) ×2 IMPLANT
NS IRRIG 1000ML POUR BTL (IV SOLUTION) ×2 IMPLANT
PACK C SECTION WH (CUSTOM PROCEDURE TRAY) ×2 IMPLANT
PAD ABD 7.5X8 STRL (GAUZE/BANDAGES/DRESSINGS) ×2 IMPLANT
PAD OB MATERNITY 4.3X12.25 (PERSONAL CARE ITEMS) ×2 IMPLANT
RTRCTR C-SECT PINK 25CM LRG (MISCELLANEOUS) ×2 IMPLANT
STAPLER VISISTAT 35W (STAPLE) IMPLANT
SUT PDS AB 0 CT1 27 (SUTURE) IMPLANT
SUT PDS AB 0 CTX 36 PDP370T (SUTURE) IMPLANT
SUT VIC AB 0 CT1 36 (SUTURE) ×4 IMPLANT
SUT VIC AB 0 CTX 36 (SUTURE) ×2
SUT VIC AB 0 CTX36XBRD ANBCTRL (SUTURE) ×2 IMPLANT
SUT VIC AB 4-0 KS 27 (SUTURE) ×2 IMPLANT
SYR 30ML LL (SYRINGE) ×2 IMPLANT
TOWEL OR 17X24 6PK STRL BLUE (TOWEL DISPOSABLE) ×6 IMPLANT
TRAY FOLEY CATH 14FR (SET/KITS/TRAYS/PACK) IMPLANT
WATER STERILE IRR 1000ML POUR (IV SOLUTION) IMPLANT

## 2012-07-03 NOTE — Anesthesia Preprocedure Evaluation (Signed)
Anesthesia Evaluation  Patient identified by MRN, date of birth, ID band Patient awake    Reviewed: Allergy & Precautions, H&P , NPO status , Patient's Chart, lab work & pertinent test results, reviewed documented beta blocker date and time   History of Anesthesia Complications Negative for: history of anesthetic complications  Airway Mallampati: III TM Distance: >3 FB Neck ROM: full    Dental  (+) Teeth Intact   Pulmonary neg pulmonary ROS,  breath sounds clear to auscultation        Cardiovascular negative cardio ROS  Rhythm:regular Rate:Normal     Neuro/Psych negative neurological ROS  negative psych ROS   GI/Hepatic negative GI ROS, Neg liver ROS,   Endo/Other  negative endocrine ROS  Renal/GU negative Renal ROS     Musculoskeletal   Abdominal   Peds  Hematology negative hematology ROS (+)   Anesthesia Other Findings NOTE pt is 4'10" tall  Reproductive/Obstetrics (+) Pregnancy                           Anesthesia Physical  Anesthesia Plan  ASA: II and emergent  Anesthesia Plan: Epidural   Post-op Pain Management:    Induction:   Airway Management Planned:   Additional Equipment:   Intra-op Plan:   Post-operative Plan:   Informed Consent: I have reviewed the patients History and Physical, chart, labs and discussed the procedure including the risks, benefits and alternatives for the proposed anesthesia with the patient or authorized representative who has indicated his/her understanding and acceptance.     Plan Discussed with: CRNA, Anesthesiologist and Surgeon  Anesthesia Plan Comments:         Anesthesia Quick Evaluation

## 2012-07-03 NOTE — Progress Notes (Signed)
Gabriela Willis is a 27 y.o. G2P0010 at [redacted]w[redacted]d admitted for PROM  Subjective: Comfortable with epidural, no complaints.   Objective: BP 102/41  Pulse 107  Temp(Src) 99 F (37.2 C) (Oral)  Resp 20  Ht 4\' 10"  (1.473 m)  Wt 150 lb (68.04 kg)  BMI 31.36 kg/m2  SpO2 98%  LMP 10/01/2011  Total I/O In: -  Out: 350 [Urine:350]  FHT:  FHR: 155 bpm, variability: minimal ,  accelerations:  Abscent,  decelerations:  Absent.  No response to scalp stimulation.  UC:  Every 4 min SVE:   7/100/+1 w/ caput  Labs: Lab Results  Component Value Date   WBC 13.1* 07/02/2012   HGB 14.3 07/02/2012   HCT 41.4 07/02/2012   MCV 87.0 07/02/2012   PLT 179 07/02/2012    Assessment / Plan: IOL 2/2 PROM, pitocin off since 0530 2/2 repetitive lates, now with loss of variability and no response to scalp stimulation all in the setting of chorioamnionitis. Given fetal intolerance of labor and non reassuring FHR pattern, recommended cesarean section.  Using a Spanish interpreter, the risks of cesarean section discussed with the patient included but were not limited to: bleeding which may require transfusion or reoperation; infection which may require antibiotics; injury to bowel, bladder, ureters or other surrounding organs; injury to the fetus; need for additional procedures including hysterectomy in the event of a life-threatening hemorrhage; placental abnormalities wth subsequent pregnancies, incisional problems, thromboembolic phenomenon and other postoperative/anesthesia complications. The patient concurred with the proposed plan, giving informed written consent for the procedure.  Anesthesia and OR aware. She is already on Vancomycin and Gentamicin for chorioamnionitis, this is sufficient for SCIP.  SCDs ordered on call to the OR.  To OR when ready.  Khadija Thier A  MD  07/03/2012, 9:56 AM

## 2012-07-03 NOTE — H&P (Signed)
Attestation of Attending Supervision of Advanced Practitioner (CNM/NP): Evaluation and management procedures were performed by the Advanced Practitioner under my supervision and collaboration.  I have reviewed the Advanced Practitioner's note and chart, and I agree with the management and plan.  Nyesha Cliff 07/03/2012 7:42 AM

## 2012-07-03 NOTE — Progress Notes (Signed)
Gabriela Willis is a 27 y.o. G2P0010 at [redacted]w[redacted]d admitted for PROM  Subjective: Comfortable w/ epidural  Objective: BP 104/61  Pulse 83  Temp(Src) 99.2 F (37.3 C) (Oral)  Resp 16  Ht 4\' 10"  (1.473 m)  Wt 68.04 kg (150 lb)  BMI 31.36 kg/m2  SpO2 100%  LMP 10/01/2011      FHT:  FHR: 155 bpm, variability: moderate,  accelerations:  Abscent,  decelerations:  Absent, had previously had a few lates that have since resolved UC:   regular, every 1-4 minutes SVE:   Dilation: 7 Effacement (%): 100 Station: +1 Exam by:: Lilli Few, RN  Labs: Lab Results  Component Value Date   WBC 13.1* 07/02/2012   HGB 14.3 07/02/2012   HCT 41.4 07/02/2012   MCV 87.0 07/02/2012   PLT 179 07/02/2012    Assessment / Plan: IOL of labor d/t prom, progressing well on pitocin, now @ 46mu/min  Labor: Progressing normally Preeclampsia:  n/a Fetal Wellbeing:  Category I Pain Control:  Epidural I/D:  n/a Anticipated MOD:  NSVD  Marge Duncans 07/03/2012, 3:08 AM

## 2012-07-03 NOTE — Progress Notes (Signed)
ANTIBIOTIC CONSULT NOTE - INITIAL  Pharmacy Consult for gentamicin  Indication: maternal fever - R/O chorioammionitis  Allergies  Allergen Reactions  . Penicillins Hives    Patient Measurements: Height: 4\' 10"  (147.3 cm) Weight: 150 lb (68.04 kg) IBW/kg (Calculated) : 40.9 Adjusted Body Weight: 50 kg  Vital Signs: Temp: 101.6 F (38.7 C) (06/15 0340) Temp src: Oral (06/15 0340) BP: 136/80 mmHg (06/15 0401) Pulse Rate: 96 (06/15 0401) Intake/Output from previous day:   Intake/Output from this shift:    Labs:  Recent Labs  07/02/12 1910  WBC 13.1*  HGB 14.3  PLT 179   CrCl is unknown because no creatinine reading has been taken. Est CrCl for age and pregnancy should be CrCl= 120-118ml/min  Microbiology: Recent Results (from the past 720 hour(s))  OB RESULTS CONSOLE GBS     Status: None   Collection Time    06/04/12 12:00 AM      Result Value Range Status   GBS Negative   Final  WET PREP, GENITAL     Status: Abnormal   Collection Time    07/01/12  5:44 AM      Result Value Range Status   Yeast Wet Prep HPF POC NONE SEEN  NONE SEEN Final   Trich, Wet Prep NONE SEEN  NONE SEEN Final   Clue Cells Wet Prep HPF POC NONE SEEN  NONE SEEN Final   WBC, Wet Prep HPF POC MODERATE (*) NONE SEEN Final   Comment: MODERATE BACTERIA SEEN    Medical History: Past Medical History  Diagnosis Date  . Hx of varicella     Medications:  Scheduled:  . vancomycin  1,000 mg Intravenous Q12H   Infusions:  . fentaNYL 2.5 mcg/ml w/bupivacaine 1/10% in NS epidural infusion ( total) 12 mL/hr (07/02/12 2246)  . lactated ringers 125 mL/hr at 07/03/12 0330  . oxytocin 40 units in LR 1000 mL    . oxytocin 40 units in LR 1000 mL    . oxytocin 40 units in LR 1000 mL 8 milli-units/min (07/03/12 0236)   PRN: acetaminophen, acetaminophen, citric acid-sodium citrate, diphenhydrAMINE, ePHEDrine, ePHEDrine, fentaNYL 2.5 mcg/ml w/bupivacaine 1/10% in NS epidural infusion  ( total), ibuprofen, lactated ringers, lidocaine (PF), ondansetron, oxyCODONE-acetaminophen, phenylephrine, phenylephrine, sodium phosphate  Assessment: 27 yr female with term pregnancy, with sx's of maternal fever, ROM.  Goal of Therapy:  Desire peak gentamicin serum level 6-41mcg/ml & trough level <32mcg/ml.  Note slightly increased risk of nephrotoxicity combine with vancomycin.  Plan:  1.  Loading  Dose Gentamicin 140mg  x 1, then 2.  Maintenance regimen gentamicin 120mg  IV q8h 3. Serum Creatinine to confirm est CrCl if tx continued 4. Measurement of actual serum gentamicin levels if tx >72hr or as clinically indicated  Scarlett Presto 07/03/2012,4:19 AM

## 2012-07-03 NOTE — Preoperative (Signed)
Beta Blockers   Reason not to administer Beta Blockers:Not Applicable 

## 2012-07-03 NOTE — Progress Notes (Signed)
1025 after monitoring FHR in OR, it appeared the wall connection to [facility] had become disconnected and FHR had traced on paper but not on the hard drive. Tracing sent to medical records. (4 minute tracing while pt dosed in OR). FHR 153-155.

## 2012-07-03 NOTE — Anesthesia Postprocedure Evaluation (Signed)
  Anesthesia Post-op Note  Patient: Gabriela Willis  Procedure(s) Performed: Procedure(s) with comments: CESAREAN SECTION (N/A) - Primary Cesarean Section Delivery Baby Girl @ 1042, Apgars 9/9   Patient Location: PACU  Anesthesia Type:Epidural  Level of Consciousness: awake, alert  and oriented  Airway and Oxygen Therapy: Patient Spontanous Breathing  Post-op Pain: none  Post-op Assessment: Post-op Vital signs reviewed, Patient's Cardiovascular Status Stable, Respiratory Function Stable, Patent Airway, No signs of Nausea or vomiting, Pain level controlled, No headache and No backache  Post-op Vital Signs: Reviewed and stable  Complications: No apparent anesthesia complications

## 2012-07-03 NOTE — Progress Notes (Signed)
Gabriela Willis is a 27 y.o. G2P0010 at [redacted]w[redacted]d admitted for PROM  Subjective: Comfortable w/ epidural, no longer shaking, has been drinking ice water, and has had cold wash cloths to axilla, groin, neck as directed  Objective: BP 99/57  Pulse 84  Temp(Src) 99.4 F (37.4 C) (Oral)  Resp 16  Ht 4\' 10"  (1.473 m)  Wt 68.04 kg (150 lb)  BMI 31.36 kg/m2  SpO2 100%  LMP 10/01/2011  99.5 oral, 100.6 axillary    FHT:  FHR: 175 bpm, variability: minimal ,  accelerations:  Abscent,  decelerations:  Present occasional lates- had been repetitive UC:   1-5 SVE:   7/100/+1 w/ caput  Labs: Lab Results  Component Value Date   WBC 13.1* 07/02/2012   HGB 14.3 07/02/2012   HCT 41.4 07/02/2012   MCV 87.0 07/02/2012   PLT 179 07/02/2012    Assessment / Plan: IOL d/t prom, pitocin off since 0530 d/t repetitive lates, now are only occasional- dr. Jolayne Panther reviewed strip, to continue to observe, if lates become repetitive again will try terbutaline Re-wet wash cloths, repositioned pt  Labor: protracted active phase d/t having to d/c pitocin Preeclampsia:  n/a Fetal Wellbeing:  Category II Pain Control:  Epidural I/D:  vanc and gent for chorioamnionitis Anticipated MOD:  NSVD  Gabriela Willis 07/03/2012, 7:31 AM

## 2012-07-03 NOTE — Transfer of Care (Signed)
Immediate Anesthesia Transfer of Care Note  Patient: Gabriela Willis  Procedure(s) Performed: Procedure(s) with comments: CESAREAN SECTION (N/A) - Primary Cesarean Section Delivery Baby Girl @ 1042, Apgars 9/9   Patient Location: PACU  Anesthesia Type:Epidural  Level of Consciousness: awake, alert  and oriented  Airway & Oxygen Therapy: Patient Spontanous Breathing  Post-op Assessment: Report given to PACU RN and Post -op Vital signs reviewed and stable  Post vital signs: Reviewed and stable  Complications: No apparent anesthesia complications

## 2012-07-03 NOTE — Op Note (Signed)
Gabriela Willis PROCEDURE DATE:  07/03/2012  PREOPERATIVE DIAGNOSES: Intrauterine pregnancy at  [redacted]w[redacted]d weeks gestation; fetal intolerance of labor; nonreassuring fetal heart rate tracing; chorioamnionitis  POSTOPERATIVE DIAGNOSES: The same  PROCEDURE: Primary Low Transverse Cesarean Section  SURGEON:  Dr. Jaynie Collins  ANESTHESIOLOGIST: Dr. Mal Amabile  INDICATIONS: Gabriela Willis is a 27 y.o. G2P1011 at [redacted]w[redacted]d here for cesarean section secondary to the indications listed under preoperative diagnoses; please see preoperative note for further details.  The risks of cesarean section were discussed with the patient including but were not limited to: bleeding which may require transfusion or reoperation; infection which may require antibiotics; injury to bowel, bladder, ureters or other surrounding organs; injury to the fetus; need for additional procedures including hysterectomy in the event of a life-threatening hemorrhage; placental abnormalities wth subsequent pregnancies, incisional problems, thromboembolic phenomenon and other postoperative/anesthesia complications.   The patient concurred with the proposed plan, giving informed written consent for the procedure.  Counseling was done with the help of a Spanish interpreter.  FINDINGS:  Viable female infant in cephalic presentation.  Apgars 9 and 9.  Heavy meconium thick amniotic fluid.  Intact placenta, three vessel cord.  Normal uterus, fallopian tubes and ovaries bilaterally.  ANESTHESIA: Epidural INTRAVENOUS FLUIDS: 2200 ml ESTIMATED BLOOD LOSS: 800 ml URINE OUTPUT:  300 ml SPECIMENS: Placenta sent to pathology COMPLICATIONS: None immediate  PROCEDURE IN DETAIL:  The patient preoperatively received intravenous antibiotics and had sequential compression devices applied to her lower extremities.  She was then taken to the operating room where the epidural anesthesia was dosed up to surgical level and was found to be  adequate. She was then placed in a dorsal supine position with a leftward tilt, and prepped and draped in a sterile manner.  A foley catheter was placed into her bladder and attached to constant gravity.  After an adequate timeout was performed, a Pfannenstiel skin incision was made with scalpel and carried through to the underlying layer of fascia. The fascia was incised in the midline, and this incision was extended bilaterally using the Mayo scissors.  Kocher clamps were applied to the superior aspect of the fascial incision and the underlying rectus muscles were dissected off bluntly. A similar process was carried out on the inferior aspect of the fascial incision. The rectus muscles were separated in the midline bluntly and the peritoneum was entered bluntly. Attention was turned to the lower uterine segment where a low transverse hysterotomy was made with a scalpel and extended bilaterally bluntly.  The infant was successfully delivered, the cord was clamped and cut and the infant was handed over to awaiting neonatology team. Uterine massage was then administered, and the placenta delivered intact with a three-vessel cord. The uterus was then cleared of clot and debris.  The hysterotomy was closed with 0 Vicryl in a running locked fashion, and an imbricating layer was also placed with 0 Vicryl. The pelvis was cleared of all clot and debris. Hemostasis was confirmed on all surfaces.  The peritoneum and the muscles were reapproximated using 0 Vicryl interrupted stitches. The fascia was then closed using 0 Vicryl in a running fashion.  The subcutaneous layer was irrigated, and 30 ml of 0.5% Marcaine was injected subcutaneously around the incision.  The skin was closed with a 4-0 Vicryl subcuticular stitch. . The patient tolerated the procedure well. Sponge, lap, instrument and needle counts were correct x 2.  She was taken to the recovery room in stable condition.

## 2012-07-04 ENCOUNTER — Ambulatory Visit (HOSPITAL_COMMUNITY): Payer: Self-pay

## 2012-07-04 ENCOUNTER — Encounter (HOSPITAL_COMMUNITY): Payer: Self-pay | Admitting: Obstetrics & Gynecology

## 2012-07-04 ENCOUNTER — Ambulatory Visit (HOSPITAL_COMMUNITY): Admission: RE | Admit: 2012-07-04 | Payer: Self-pay | Source: Ambulatory Visit

## 2012-07-04 LAB — CBC
MCV: 87.3 fL (ref 78.0–100.0)
Platelets: 122 10*3/uL — ABNORMAL LOW (ref 150–400)
RBC: 3.38 MIL/uL — ABNORMAL LOW (ref 3.87–5.11)
RDW: 15.4 % (ref 11.5–15.5)
WBC: 18.4 10*3/uL — ABNORMAL HIGH (ref 4.0–10.5)

## 2012-07-04 NOTE — Anesthesia Postprocedure Evaluation (Signed)
  Anesthesia Post-op Note  Patient: Gabriela Willis  Procedure(s) Performed: * No procedures listed *  Patient Location: Mother/Baby  Anesthesia Type:Epidural  Level of Consciousness: awake, alert  and oriented  Airway and Oxygen Therapy: Patient Spontanous Breathing  Post-op Pain: mild  Post-op Assessment: Post-op Vital signs reviewed, Patient's Cardiovascular Status Stable, Respiratory Function Stable, Patent Airway, No signs of Nausea or vomiting, Adequate PO intake, Pain level controlled, No headache, No backache, No residual numbness and No residual motor weakness  Post-op Vital Signs: Reviewed and stable  Complications: No apparent anesthesia complications

## 2012-07-04 NOTE — Progress Notes (Signed)
UR completed 

## 2012-07-04 NOTE — Progress Notes (Signed)
Subjective: Postpartum Day 1: Cesarean Delivery Patient reports tolerating PO. She has not yet been out of bed. Foley is out, she has attempted to void once this morning but was unable to. She also denies flatus.   Objective: Vital signs in last 24 hours: Temp:  [97.4 F (36.3 C)-99.1 F (37.3 C)] 98.6 F (37 C) (06/16 0619) Pulse Rate:  [62-117] 84 (06/16 0619) Resp:  [16-20] 18 (06/16 0619) BP: (88-132)/(34-74) 88/46 mmHg (06/16 0619) SpO2:  [93 %-100 %] 95 % (06/16 1610)  Physical Exam:  General: alert and cooperative Lochia: appropriate Uterine Fundus: firm Incision: healing well, no significant drainage, no dehiscence, no significant erythema DVT Evaluation: No evidence of DVT seen on physical exam. No cords or calf tenderness. No significant calf/ankle edema.   Recent Labs  07/02/12 1910 07/04/12 0625  HGB 14.3 10.0*  HCT 41.4 29.5*    Assessment/Plan: Status post Cesarean section. Doing well postoperatively.  Continue current care. Begin PO fluids and if still no UOP, initiate LR 1L bolus with maintenance at 163mL/h until UOP 30-70mL/h. Patient plans to breast feed, and follow up is planned for [facility]. She has discussed with [facility] the desire to use OCP's for contraceptive. Recommend 2 week follow up with [facility] for inspection of incision site.   Arther Abbott 07/04/2012, 7:40 AM

## 2012-07-05 MED ORDER — IBUPROFEN 600 MG PO TABS: 600.0000 mg | ORAL_TABLET | Freq: Four times a day (QID) | ORAL | Status: DC

## 2012-07-05 MED ORDER — OXYCODONE-ACETAMINOPHEN 5-325 MG PO TABS: 1.0000 | ORAL_TABLET | ORAL | Status: DC | PRN

## 2012-07-05 NOTE — Progress Notes (Signed)
Assisted FP with discharge plans

## 2012-07-05 NOTE — Discharge Summary (Signed)
Obstetric Discharge Summary Reason for Admission: onset of labor and cesarean section Prenatal Procedures: ultrasound Intrapartum Procedures: cesarean: low cervical, transverse Postpartum Procedures: antibiotics Complications-Operative and Postpartum: none Hemoglobin  Date Value Range Status  07/04/2012 10.0* 12.0 - 15.0 g/dL Final     REPEATED TO VERIFY     DELTA CHECK NOTED     HCT  Date Value Range Status  07/04/2012 29.5* 36.0 - 46.0 % Final    Physical Exam:  General: alert and cooperative Lochia: appropriate Uterine Fundus: firm Incision: healing well, no significant drainage, no dehiscence, no significant erythema DVT Evaluation: No evidence of DVT seen on physical exam. No cords or calf tenderness. No significant calf/ankle edema.  Discharge Diagnoses: Term Pregnancy-delivered  Discharge Information: Date: 07/05/2012 Activity: pelvic rest Diet: routine Medications: PNV, Ibuprofen and Percocet Condition: stable Instructions: refer to practice specific booklet Discharge to: home Follow-up Information   Follow up with [facility]. Call in 2 weeks. (For incision check)      Patient requests discharge after 3pm so her husband can pick her up after work.  Newborn Data: Live born female  Birth Weight: 6 lb 12.8 oz (3085 g) APGAR: 9, 9  Home with mother.  Arther Abbott 07/05/2012, 7:54 AM Evaluation and management procedures were performed by PA-S under my supervision/collaboration. Chart reviewed, patient examined by me and I agree with management and plan. Breasts filling. LC here. Uterus involuting, NT, honeycomb dressing in place and dry. Instructed to remove in 2 days. Baby getting rpt bili>probable d/c.  Danae Orleans, CNM 07/05/2012 9:57 AM

## 2012-07-05 NOTE — Progress Notes (Signed)
Assisted Melissa RN with discharge instructions.

## 2012-07-06 NOTE — Progress Notes (Signed)
I saw and examined patient along with student and agree with above note.   Bill Mcvey 07/06/2012 1:42 PM   

## 2012-07-09 ENCOUNTER — Inpatient Hospital Stay (HOSPITAL_COMMUNITY): Admission: RE | Admit: 2012-07-09 | Payer: Self-pay | Source: Ambulatory Visit

## 2013-06-05 IMAGING — US US OB DETAIL+14 WK
2 of 3 series · 12 of 28 positions shown · non-contrast
Comparison: none

[Series 1: us ob detail +14 wk · 10 of 78 slices shown (1 of 2)]
[im 4/78]
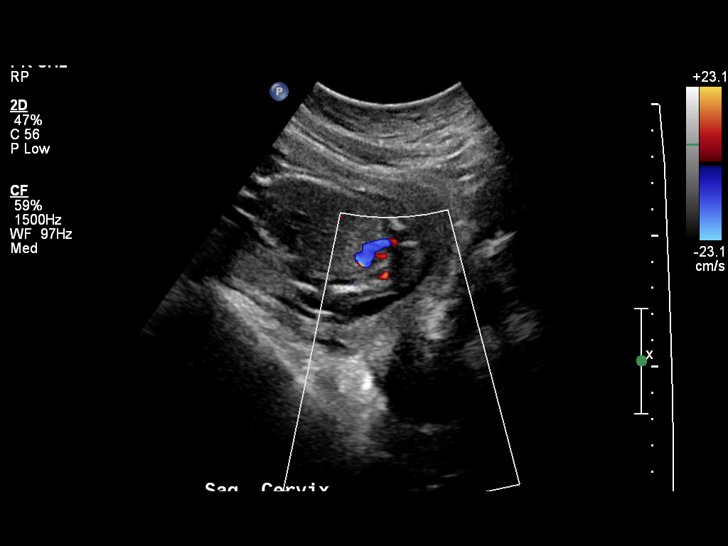
[im 11/78]
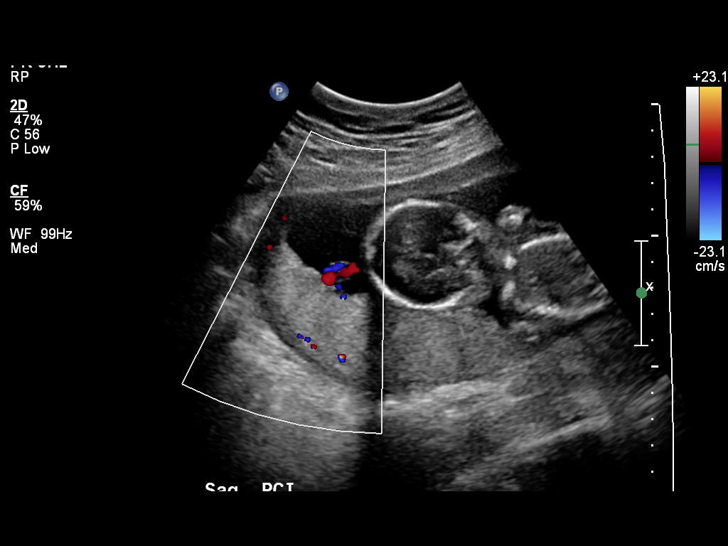
[im 17/78]
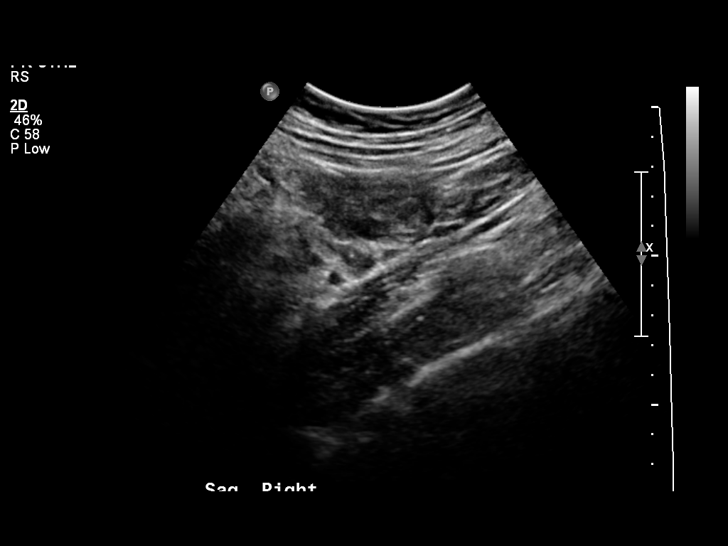
[im 27/78]
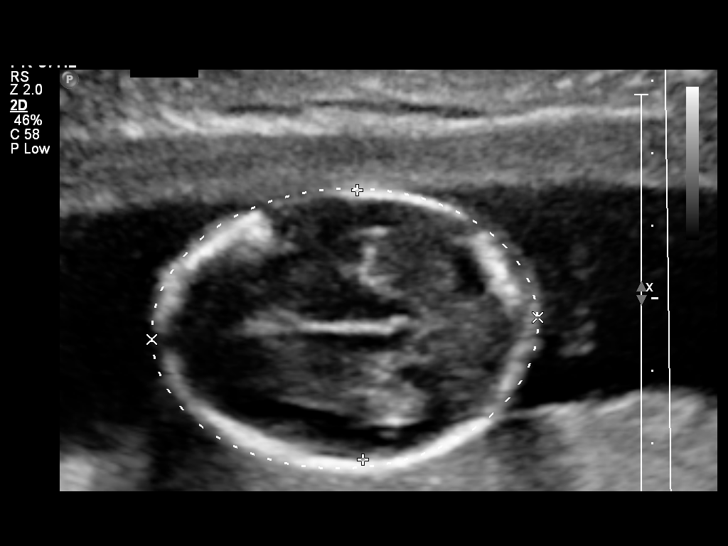
[im 34/78]
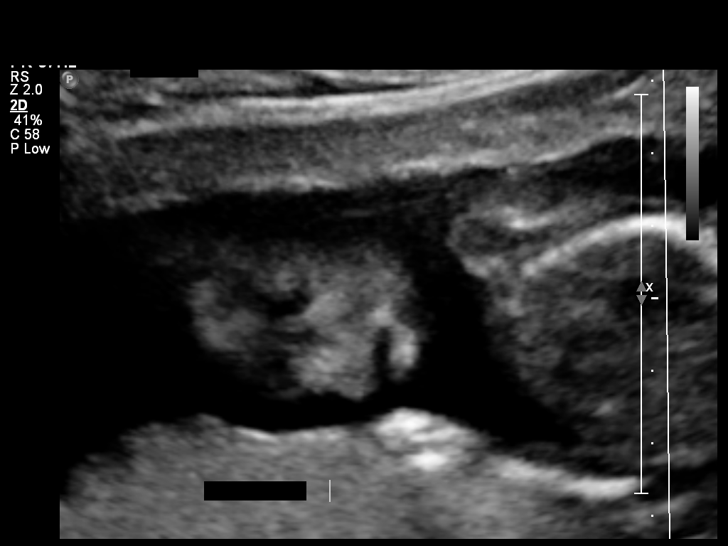
[im 41/78]
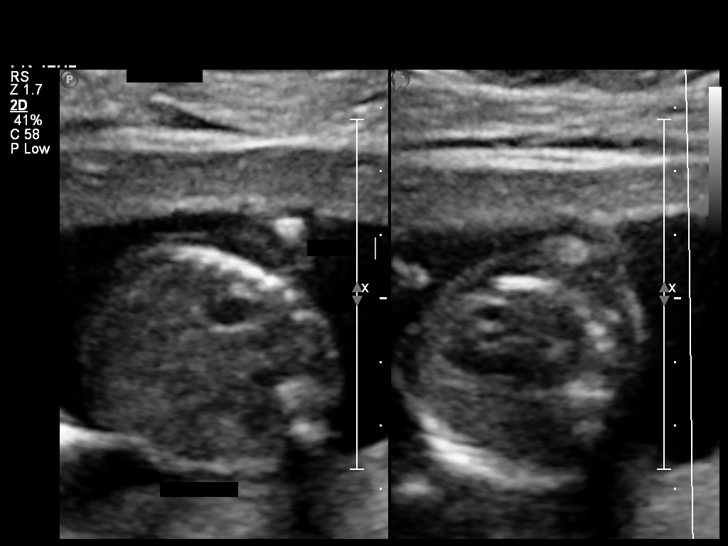
[im 51/78]
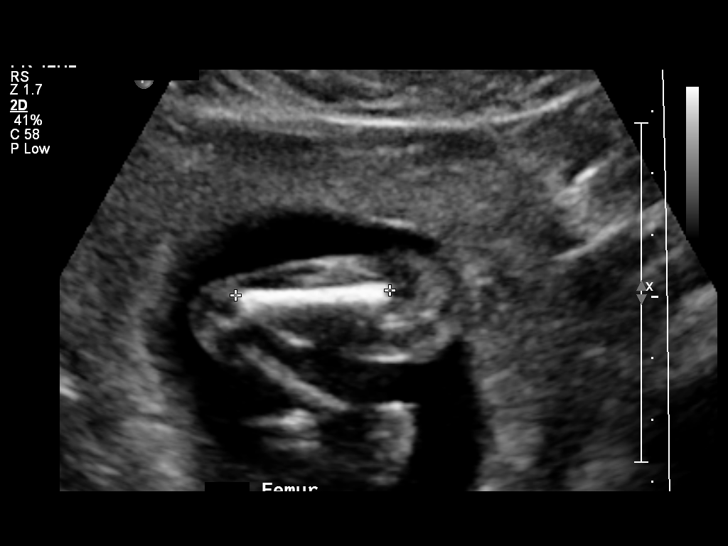
[im 57/78]
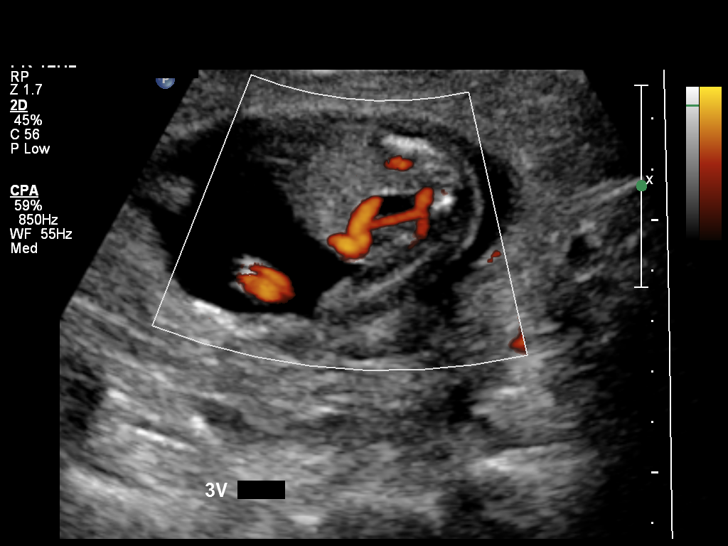
[im 64/78]
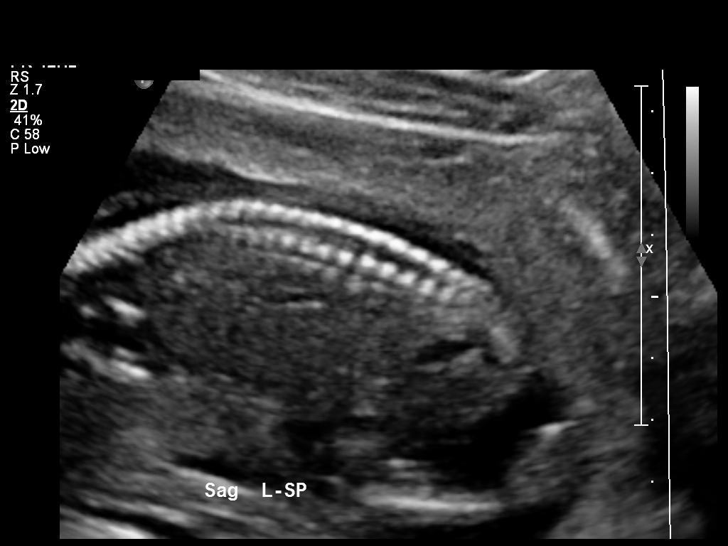
[im 74/78]
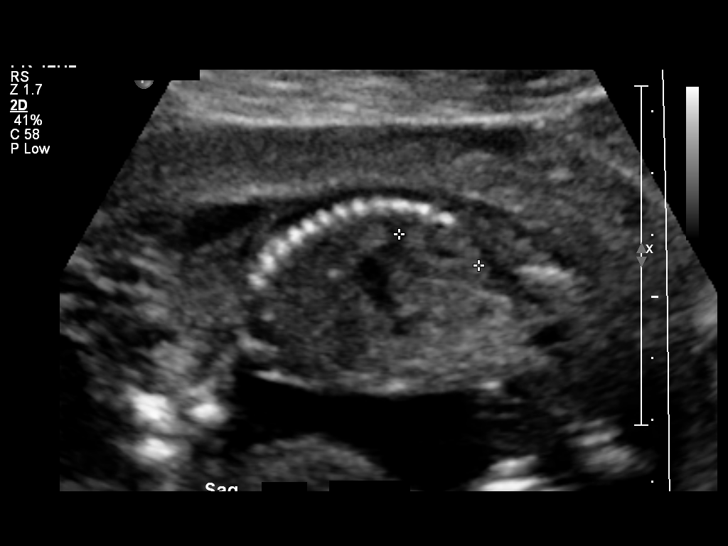

[Series 1: us ob detail +14 wk · 2 of 8 slices shown (2 of 2)]
[im 1/8]
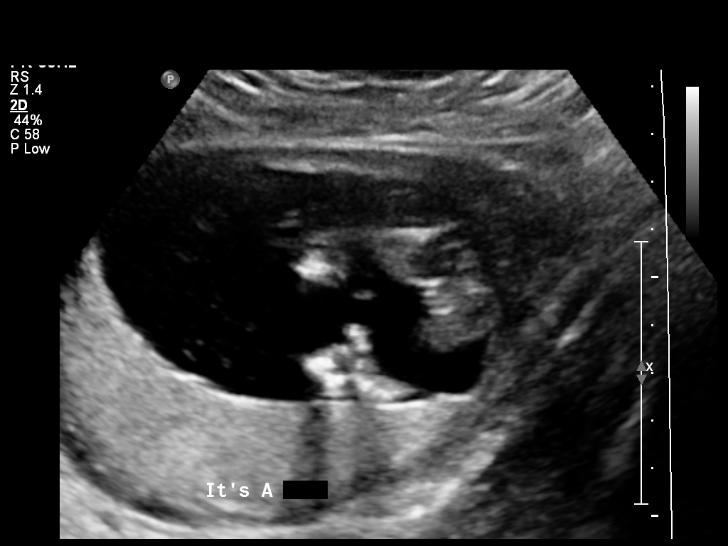
[im 8/8]
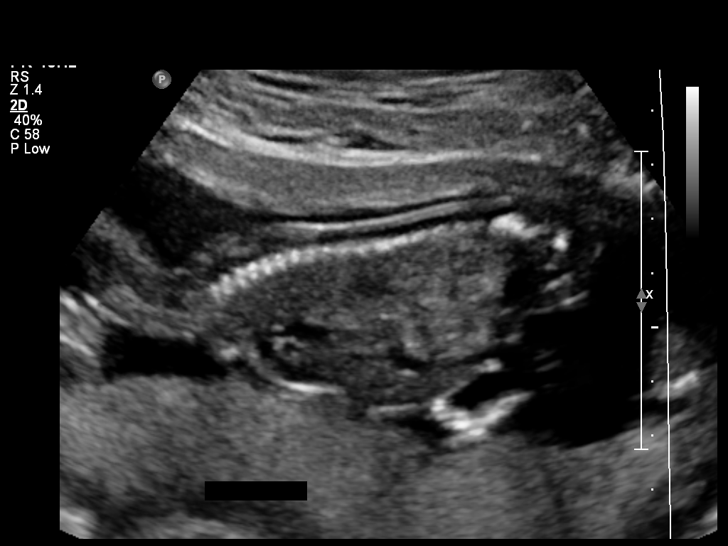

[12 of 28 positions shown; findings below may reference images not displayed]

OBSTETRICS REPORT
                      (Signed Final 01/27/2012 [DATE])

             KANI

Service(s) Provided

 US OB DETAIL + 14 WK                                  76811.0
Indications

 Detailed fetal anatomic survey
Fetal Evaluation

 Num Of Fetuses:    1
 Fetal Heart Rate:  150                         bpm
 Cardiac Activity:  Observed
 Presentation:      Variable
 Placenta:          Posterior, above cervical
                    os
 P. Cord            Visualized
 Insertion:

 Amniotic Fluid
 AFI FV:      Subjectively within normal limits
                                             Larg Pckt:     4.8  cm
Biometry

 BPD:     37.4  mm    G. Age:   17w 3d                CI:        68.61   70 - 86
                                                      FL/HC:      17.1   15.8 -
                                                                         18
 HC:     144.3  mm    G. Age:   17w 4d       25  %    HC/AC:      1.18   1.07 -

 AC:     122.6  mm    G. Age:   17w 6d       45  %    FL/BPD:
 FL:      24.7  mm    G. Age:   17w 3d       25  %    FL/AC:      20.1   20 - 24
 HUM:     24.6  mm    G. Age:   17w 5d       46  %
 CER:       17  mm    G. Age:   17w 0d       28  %
 NFT:        4  mm

 Est. FW:     205  gm      0 lb 7 oz     42  %
Gestational Age

 LMP:           16w 6d       Date:   10/01/11                 EDD:   07/07/12
 Clinical EDD:  18w 0d                                        EDD:   06/29/12
 U/S Today:     17w 4d                                        EDD:   07/02/12
 Best:          18w 0d    Det. By:   Clinical EDD             EDD:   06/29/12
2nd Trimester Genetic Sonogram - Trisomy 21 Screening
 Age:                                             27          Risk=1:   885

 Structural anomalies (inc. cardiac):             No
 Echogenic bowel:                                 No
 Pyelectasis:                                     No
 2-vessel umbilical cord:                         No
Anatomy

 Cranium:          Appears normal         Aortic Arch:      Appears normal
 Fetal Cavum:      Appears normal         Ductal Arch:      Appears normal
 Ventricles:       Appears normal         Diaphragm:        Appears normal
 Choroid Plexus:   Appears normal         Stomach:          Appears normal, left
                                                            sided
 Cerebellum:       Appears normal         Abdomen:          Appears normal
 Posterior Fossa:  Appears normal         Abdominal Wall:   Not well visualized
 Nuchal Fold:      Appears normal         Cord Vessels:     Appears normal (3
                                                            vessel cord)
 Face:             Orbits appear          Kidneys:          Appear normal
                   normal
 Lips:             Appears normal         Bladder:          Appears normal
 Heart:            Not well visualized    Spine:            Appears normal
 RVOT:             Not well visualized    Lower             Appears normal
                                          Extremities:
 LVOT:             Not well visualized    Upper             Appears normal
                                          Extremities:

 Other:  Fetus appears to be a female. Left heel visualized. Technically
         difficult due to fetal position.
Targeted Anatomy

 Fetal Central Nervous System
 Lat. Ventricles:  7.1                    Cisterna Magna:
Cervix Uterus Adnexa

 Cervical Length:   3.62      cm

 Cervix:       Normal appearance by transabdominal scan. Closed.
 Left Ovary:   Size(cm) L: 2.48 x W: 1.25 x H: 1.76  Volume(cc):
 Right Ovary:  Size(cm) L: 4 x W: 2.6 x H: 1.93  Volume(cc):

 Adnexa:     No abnormality visualized.
Impression

 Assigned GA is currently 18w 0d by established clinical EDD.
 Concordant fetal biometry on today's exam.
 Suboptimal visualization of fetal anatomy, however no fetal
 anomalies identified.  Fetal heart, outflow tracts, and BINI
 could not be visualized.
Recommendations

 Consider follow-up ultrasound to complete fetal anatomic
 evaluation in 3-4 weeks.

## 2013-11-20 ENCOUNTER — Encounter (HOSPITAL_COMMUNITY): Payer: Self-pay | Admitting: Obstetrics & Gynecology

## 2017-05-04 ENCOUNTER — Encounter (HOSPITAL_COMMUNITY): Payer: Self-pay | Admitting: Emergency Medicine

## 2017-05-04 ENCOUNTER — Ambulatory Visit (HOSPITAL_COMMUNITY)
Admission: EM | Admit: 2017-05-04 | Discharge: 2017-05-04 | Disposition: A | Discharge status: Home / Self Care | Payer: Self-pay | Attending: Family Medicine | Admitting: Family Medicine

## 2017-05-04 DIAGNOSIS — R0602 Shortness of breath: Secondary | ICD-10-CM

## 2017-05-04 MED ORDER — ALBUTEROL SULFATE HFA 108 (90 BASE) MCG/ACT IN AERS: 1.0000 | INHALATION_SPRAY | Freq: Four times a day (QID) | RESPIRATORY_TRACT | 0 refills | Status: DC | PRN

## 2017-05-04 NOTE — ED Triage Notes (Signed)
Pt states "I cant breathe really good"  Pt states "I was driving and I had to stop because I couldn't see very well" Pt endorses blurred vision. Resp e/u.

## 2017-05-04 NOTE — ED Provider Notes (Signed)
[facility]    CSN: [id number] Arrival date & time: 05/04/17  1821     History   Chief Complaint Chief Complaint  Patient presents with  . Shortness of Breath    HPI Gabriela Willis is a 32 y.o. female.   Symptoms are shortness of breath with exertion.  There is a language barrier difficulty.  She has no history of asthma or heart disease.  She denies fever or cough.  HPI  Past Medical History:  Diagnosis Date  . Hx of varicella     There are no active problems to display for this patient.   Past Surgical History:  Procedure Laterality Date  . CESAREAN SECTION N/A 07/03/2012   Procedure: CESAREAN SECTION;  Surgeon: Tereso Newcomer, MD;  Location: [facility];  Service: Obstetrics;  Laterality: N/A;  Primary Cesarean Section Delivery Baby Girl @ 1042, Apgars 9/9     OB History    Gravida  2   Para  1   Term  1   Preterm  0   AB  1   Living  1     SAB  1   TAB  0   Ectopic  0   Multiple  0   Live Births  1            Home Medications    Prior to Admission medications   Medication Sig Start Date End Date Taking? Authorizing Provider  ibuprofen (ADVIL,MOTRIN) 600 MG tablet Take 1 tablet (600 mg total) by mouth every 6 (six) hours. 07/05/12   Poe, Deirdre C, CNM  oxyCODONE-acetaminophen (PERCOCET/ROXICET) 5-325 MG per tablet Take 1-2 tablets by mouth every 4 (four) hours as needed. 07/05/12   Poe, Deirdre C, CNM  Prenatal Vit-Fe Fumarate-FA (PRENATAL MULTIVITAMIN) TABS Take 1 tablet by mouth at bedtime.    [provider]    Family History Family History  Problem Relation Age of Onset  . Asthma Brother     Social History Social History   Tobacco Use  . Smoking status: Never Smoker  . Smokeless tobacco: Never Used  Substance Use Topics  . Alcohol use: No  . Drug use: No     Allergies   Penicillins   Review of Systems Review of Systems  Constitutional: Negative.   HENT: Negative.   Respiratory: Positive  for shortness of breath.   Cardiovascular: Negative.   Gastrointestinal: Negative.   Psychiatric/Behavioral: Negative.      Physical Exam Triage Vital Signs ED Triage Vitals  Enc Vitals Group     BP 05/04/17 1838 126/70     Pulse Rate 05/04/17 1838 80     Resp 05/04/17 1838 16     Temp 05/04/17 1838 98.4 F (36.9 C)     Temp src --      SpO2 05/04/17 1838 100 %     Weight --      Height --      Head Circumference --      Peak Flow --      Pain Score 05/04/17 1840 0     Pain Loc --      Pain Edu? --      Excl. in GC? --    No data found.  Updated Vital Signs BP 126/70   Pulse 80   Temp 98.4 F (36.9 C)   Resp 16   SpO2 100%   Visual Acuity Right Eye Distance:   Left Eye Distance:   Bilateral Distance:  Right Eye Near:   Left Eye Near:    Bilateral Near:     Physical Exam  Constitutional: She appears well-developed and well-nourished.  HENT:  Mouth/Throat: Oropharynx is clear and moist.  Eyes: Pupils are equal, round, and reactive to light.  Cardiovascular: Normal rate, regular rhythm and normal heart sounds.  Pulmonary/Chest: Breath sounds normal. She is in respiratory distress. She exhibits no tenderness.     UC Treatments / Results  Labs (all labs ordered are listed, but only abnormal results are displayed) Labs Reviewed - No data to display  EKG None Radiology No results found.  Procedures Procedures (including critical care time)  Medications Ordered in UC Medications - No data to display   Initial Impression / Assessment and Plan / UC Course  I have reviewed the triage vital signs and the nursing notes.  Pertinent labs & imaging results that were available during my care of the patient were reviewed by me and considered in my medical decision making (see chart for details).     Shortness of breath.  Sats are 100%.  There is no wheezing.  I wonder if this might be exertional asthma.  I will try her on albuterol inhaler to use prior  to exertion.  Final Clinical Impressions(s) / UC Diagnoses   Final diagnoses:  SOB (shortness of breath)    ED Discharge Orders    None       Controlled Substance Prescriptions [place] Controlled Substance Registry consulted? No   Frederica KusterMiller, Reinette Cuneo M, MD 05/04/17 [phone]

## 2018-01-29 ENCOUNTER — Other Ambulatory Visit: Payer: Self-pay

## 2018-01-29 ENCOUNTER — Ambulatory Visit (HOSPITAL_COMMUNITY)
Admission: EM | Admit: 2018-01-29 | Discharge: 2018-01-29 | Disposition: A | Discharge status: Home / Self Care | Payer: Self-pay | Attending: Family Medicine | Admitting: Family Medicine

## 2018-01-29 ENCOUNTER — Encounter (HOSPITAL_COMMUNITY): Payer: Self-pay | Admitting: Emergency Medicine

## 2018-01-29 DIAGNOSIS — R69 Illness, unspecified: Secondary | ICD-10-CM | POA: Insufficient documentation

## 2018-01-29 DIAGNOSIS — J111 Influenza due to unidentified influenza virus with other respiratory manifestations: Secondary | ICD-10-CM

## 2018-01-29 LAB — POCT RAPID STREP A: STREPTOCOCCUS, GROUP A SCREEN (DIRECT): NEGATIVE

## 2018-01-29 MED ORDER — OSELTAMIVIR PHOSPHATE 75 MG PO CAPS: 75.0000 mg | ORAL_CAPSULE | Freq: Two times a day (BID) | ORAL | 0 refills | Status: DC

## 2018-01-29 NOTE — ED Provider Notes (Signed)
Patient: Gabriela Willis MRN: [id number] DOB: 1985-11-26 PCP: Quentin Mulling, PA-C     Subjective:  Chief Complaint  Patient presents with  . Cough   *VRI used for entire appointment.   HPI: The patient is a 33 y.o. female who presents today for cough and fever for 3 days. She has no medical history and is [redacted] weeks pregnant with her second child. She denies any contractions, vaginal bleeding or discharge. She is taking her PNV. She started to feel sick on Thursday afternoon. She was very cold/chills, sore throat and headache. She also is very achy, "pain in the bones." she has not had her flu shot. She does have a cough that is dry in nature. She has no shortness of breath or wheezing. She has not taken any otc medications. No tylenol for her fever. She is not eating much, but is drinking well. No sick contacts.   Review of Systems  Constitutional: Positive for chills and fever.  HENT: Positive for congestion, sinus pressure, sinus pain and sore throat. Negative for ear pain.   Respiratory: Positive for cough. Negative for shortness of breath and wheezing.   Gastrointestinal: Negative for abdominal pain, diarrhea, nausea and vomiting.  Genitourinary: Negative for vaginal bleeding and vaginal discharge.    Allergies Patient is allergic to penicillins.  Past Medical History Patient  has a past medical history of varicella.  Surgical History Patient  has a past surgical history that includes Cesarean section (N/A, 07/03/2012).  Family History Pateint's family history includes Asthma in her brother.  Social History Patient  reports that she has never smoked. She has never used smokeless tobacco. She reports that she does not drink alcohol or use drugs.    Objective: Vitals:   01/29/18 1503  BP: 112/70  Pulse: (!) 112  Resp: 18  Temp: (!) 101 F (38.3 C)  TempSrc: Oral  SpO2: 100%    There is no height or weight on file to calculate BMI.  Physical Exam Vitals  signs reviewed.  HENT:     Right Ear: Tympanic membrane, ear canal and external ear normal.     Left Ear: Tympanic membrane, ear canal and external ear normal.     Nose: Congestion present.     Mouth/Throat:     Pharynx: Oropharyngeal exudate and posterior oropharyngeal erythema present.  Neck:     Musculoskeletal: Normal range of motion.  Cardiovascular:     Rate and Rhythm: Regular rhythm. Tachycardia present.     Heart sounds: Normal heart sounds.  Pulmonary:     Effort: Pulmonary effort is normal. No respiratory distress.     Breath sounds: Normal breath sounds. No wheezing or rales.  Abdominal:     General: Abdomen is flat. Bowel sounds are normal.     Palpations: Abdomen is soft.  Lymphadenopathy:     Cervical: Cervical adenopathy present.  Skin:    Capillary Refill: Capillary refill takes less than 2 seconds.  Neurological:     Mental Status: She is alert.       rapid strep: negative  Assessment/plan: The encounter diagnosis was Influenza-like illness. Treating with tamiflu, especially in pregnancy. Discussed tylenol only for anti-pyretics. Push fluids. Discussed over the counter medication that is safe in pregnancy. Continue PNV. Any shortness of breath, worsening symptoms return or go to ER. Keep f/u with OB.    Orland Mustard, MD  01/29/2018    Orland Mustard, MD 01/29/18 1850

## 2018-01-29 NOTE — ED Triage Notes (Signed)
The patient presented to the [facility] with a complaint of a cough and chills x 3 days.

## 2018-01-31 LAB — CULTURE, GROUP A STREP (THRC)

## 2018-02-28 LAB — OB RESULTS CONSOLE ABO/RH
RH Type: NEGATIVE
RH Type: POSITIVE
RH Type: POSITIVE
RH Type: POSITIVE

## 2018-02-28 LAB — OB RESULTS CONSOLE HIV ANTIBODY (ROUTINE TESTING): HIV: NONREACTIVE

## 2018-02-28 LAB — OB RESULTS CONSOLE ANTIBODY SCREEN: Antibody Screen: NEGATIVE

## 2018-02-28 LAB — OB RESULTS CONSOLE RUBELLA ANTIBODY, IGM: Rubella: IMMUNE

## 2018-02-28 LAB — OB RESULTS CONSOLE RPR: RPR: NONREACTIVE

## 2018-02-28 LAB — OB RESULTS CONSOLE GC/CHLAMYDIA
Chlamydia: NEGATIVE
Gonorrhea: NEGATIVE

## 2018-02-28 LAB — OB RESULTS CONSOLE HEPATITIS B SURFACE ANTIGEN: Hepatitis B Surface Ag: NEGATIVE

## 2018-07-12 LAB — OB RESULTS CONSOLE GBS: GBS: NEGATIVE

## 2018-08-02 ENCOUNTER — Other Ambulatory Visit: Payer: Self-pay

## 2018-08-02 ENCOUNTER — Inpatient Hospital Stay (HOSPITAL_COMMUNITY)
Admission: AD | Admit: 2018-08-02 | Discharge: 2018-08-02 | Disposition: A | Discharge status: Home / Self Care | Payer: Self-pay | Source: Ambulatory Visit | Attending: Obstetrics and Gynecology | Admitting: Obstetrics and Gynecology

## 2018-08-02 ENCOUNTER — Inpatient Hospital Stay (HOSPITAL_COMMUNITY)
Admission: AD | Admit: 2018-08-02 | Discharge: 2018-08-05 | DRG: 788 | Disposition: A | Discharge status: Home / Self Care | Payer: Medicaid Other | Attending: Obstetrics and Gynecology | Admitting: Obstetrics and Gynecology

## 2018-08-02 ENCOUNTER — Inpatient Hospital Stay (HOSPITAL_COMMUNITY): Payer: Medicaid Other | Admitting: Anesthesiology

## 2018-08-02 ENCOUNTER — Encounter (HOSPITAL_COMMUNITY): Payer: Self-pay

## 2018-08-02 DIAGNOSIS — O9902 Anemia complicating childbirth: Secondary | ICD-10-CM | POA: Diagnosis present

## 2018-08-02 DIAGNOSIS — O4290 Premature rupture of membranes, unspecified as to length of time between rupture and onset of labor, unspecified weeks of gestation: Secondary | ICD-10-CM | POA: Diagnosis present

## 2018-08-02 DIAGNOSIS — Z1159 Encounter for screening for other viral diseases: Secondary | ICD-10-CM

## 2018-08-02 DIAGNOSIS — O34211 Maternal care for low transverse scar from previous cesarean delivery: Principal | ICD-10-CM | POA: Diagnosis present

## 2018-08-02 DIAGNOSIS — Z3A40 40 weeks gestation of pregnancy: Secondary | ICD-10-CM | POA: Insufficient documentation

## 2018-08-02 DIAGNOSIS — O479 False labor, unspecified: Secondary | ICD-10-CM

## 2018-08-02 DIAGNOSIS — Z3A39 39 weeks gestation of pregnancy: Secondary | ICD-10-CM

## 2018-08-02 DIAGNOSIS — O480 Post-term pregnancy: Secondary | ICD-10-CM | POA: Insufficient documentation

## 2018-08-02 DIAGNOSIS — O471 False labor at or after 37 completed weeks of gestation: Secondary | ICD-10-CM | POA: Insufficient documentation

## 2018-08-02 DIAGNOSIS — O26893 Other specified pregnancy related conditions, third trimester: Secondary | ICD-10-CM | POA: Diagnosis present

## 2018-08-02 DIAGNOSIS — O4292 Full-term premature rupture of membranes, unspecified as to length of time between rupture and onset of labor: Secondary | ICD-10-CM | POA: Diagnosis present

## 2018-08-02 DIAGNOSIS — D649 Anemia, unspecified: Secondary | ICD-10-CM | POA: Diagnosis present

## 2018-08-02 DIAGNOSIS — Z3689 Encounter for other specified antenatal screening: Secondary | ICD-10-CM

## 2018-08-02 DIAGNOSIS — Z98891 History of uterine scar from previous surgery: Secondary | ICD-10-CM

## 2018-08-02 LAB — CBC
HCT: 43.3 % (ref 36.0–46.0)
Hemoglobin: 14 g/dL (ref 12.0–15.0)
MCH: 27.8 pg (ref 26.0–34.0)
MCHC: 32.3 g/dL (ref 30.0–36.0)
MCV: 85.9 fL (ref 80.0–100.0)
Platelets: 223 10*3/uL (ref 150–400)
RBC: 5.04 MIL/uL (ref 3.87–5.11)
RDW: 15.5 % (ref 11.5–15.5)
WBC: 10.7 10*3/uL — ABNORMAL HIGH (ref 4.0–10.5)
nRBC: 0 % (ref 0.0–0.2)

## 2018-08-02 LAB — TYPE AND SCREEN
ABO/RH(D): O POS
Antibody Screen: NEGATIVE

## 2018-08-02 LAB — POCT FERN TEST: POCT Fern Test: POSITIVE

## 2018-08-02 LAB — SARS CORONAVIRUS 2 BY RT PCR (HOSPITAL ORDER, PERFORMED IN ~~LOC~~ HOSPITAL LAB): SARS Coronavirus 2: NEGATIVE

## 2018-08-02 MED ORDER — PHENYLEPHRINE 40 MCG/ML (10ML) SYRINGE FOR IV PUSH (FOR BLOOD PRESSURE SUPPORT): 80.0000 ug | PREFILLED_SYRINGE | INTRAVENOUS | Status: DC | PRN

## 2018-08-02 MED ORDER — DIPHENHYDRAMINE HCL 50 MG/ML IJ SOLN: 12.5000 mg | INTRAMUSCULAR | Status: DC | PRN

## 2018-08-02 MED ORDER — LACTATED RINGERS IV SOLN
INTRAVENOUS | Status: DC
  Administered 2018-08-02 – 2018-08-03 (×3): via INTRAVENOUS

## 2018-08-02 MED ORDER — ACETAMINOPHEN 325 MG PO TABS: 650.0000 mg | ORAL_TABLET | ORAL | Status: DC | PRN

## 2018-08-02 MED ORDER — LACTATED RINGERS IV SOLN
500.0000 mL | INTRAVENOUS | Status: DC | PRN
  Administered 2018-08-03 (×2): 500 mL via INTRAVENOUS
  Administered 2018-08-03: 14:00:00 via INTRAVENOUS

## 2018-08-02 MED ORDER — ONDANSETRON HCL 4 MG/2ML IJ SOLN
4.0000 mg | Freq: Four times a day (QID) | INTRAMUSCULAR | Status: DC | PRN
  Administered 2018-08-03: 4 mg via INTRAVENOUS

## 2018-08-02 MED ORDER — OXYTOCIN BOLUS FROM INFUSION: 500.0000 mL | Freq: Once | INTRAVENOUS | Status: DC

## 2018-08-02 MED ORDER — OXYTOCIN 40 UNITS IN NORMAL SALINE INFUSION - SIMPLE MED
2.5000 [IU]/h | INTRAVENOUS | Status: DC
  Administered 2018-08-03 (×5): 100 mL via INTRAVENOUS

## 2018-08-02 MED ORDER — LIDOCAINE HCL (PF) 1 % IJ SOLN: 30.0000 mL | INTRAMUSCULAR | Status: DC | PRN

## 2018-08-02 MED ORDER — FLEET ENEMA 7-19 GM/118ML RE ENEM: 1.0000 | ENEMA | RECTAL | Status: DC | PRN

## 2018-08-02 MED ORDER — SOD CITRATE-CITRIC ACID 500-334 MG/5ML PO SOLN
30.0000 mL | ORAL | Status: DC | PRN
  Administered 2018-08-03: 30 mL via ORAL
  Filled 2018-08-02: qty 30

## 2018-08-02 MED ORDER — OXYCODONE-ACETAMINOPHEN 5-325 MG PO TABS: 1.0000 | ORAL_TABLET | ORAL | Status: DC | PRN

## 2018-08-02 MED ORDER — LACTATED RINGERS IV SOLN
500.0000 mL | Freq: Once | INTRAVENOUS | Status: AC
  Administered 2018-08-02: 500 mL via INTRAVENOUS

## 2018-08-02 MED ORDER — OXYCODONE-ACETAMINOPHEN 5-325 MG PO TABS: 2.0000 | ORAL_TABLET | ORAL | Status: DC | PRN

## 2018-08-02 MED ORDER — EPHEDRINE 5 MG/ML INJ: 10.0000 mg | INTRAVENOUS | Status: DC | PRN

## 2018-08-02 MED ORDER — PHENYLEPHRINE 40 MCG/ML (10ML) SYRINGE FOR IV PUSH (FOR BLOOD PRESSURE SUPPORT)
80.0000 ug | PREFILLED_SYRINGE | INTRAVENOUS | Status: DC | PRN
  Filled 2018-08-02: qty 10

## 2018-08-02 MED ORDER — FENTANYL-BUPIVACAINE-NACL 0.5-0.125-0.9 MG/250ML-% EP SOLN
12.0000 mL/h | EPIDURAL | Status: DC | PRN
  Filled 2018-08-02: qty 250

## 2018-08-02 NOTE — H&P (Addendum)
LABOR AND DELIVERY ADMISSION HISTORY AND PHYSICAL NOTE  Tilda Samudio is a 33 y.o. female G3P1011 with IUP at [redacted]w[redacted]d by LMP presenting for SROM at approximately 1700 with clear fluid that was confirmed fern positive in MAU. She reports positive fetal movement and is feeling her contractions. Denies any vaginal bleeding.  Prenatal History/Complications: PNC at HD Pregnancy complications:  - None  Past Medical History: Past Medical History:  Diagnosis Date  . Hx of varicella     Past Surgical History: Past Surgical History:  Procedure Laterality Date  . CESAREAN SECTION N/A 07/03/2012   Procedure: CESAREAN SECTION;  Surgeon: Osborne Oman, MD;  Location: [facility];  Service: Obstetrics;  Laterality: N/A;  Primary Cesarean Section Delivery Baby Girl @ 6195, Apgars 9/9     Obstetrical History: OB History    Gravida  3   Para  1   Term  1   Preterm  0   AB  1   Living  1     SAB  1   TAB  0   Ectopic  0   Multiple  0   Live Births  1           Social History: Social History   Socioeconomic History  . Marital status: Single    Spouse name: Not on file  . Number of children: Not on file  . Years of education: Not on file  . Highest education level: Not on file  Occupational History  . Not on file  Social Needs  . Financial resource strain: Not on file  . Food insecurity    Worry: Not on file    Inability: Not on file  . Transportation needs    Medical: Not on file    Non-medical: Not on file  Tobacco Use  . Smoking status: Never Smoker  . Smokeless tobacco: Never Used  Substance and Sexual Activity  . Alcohol use: No  . Drug use: No  . Sexual activity: Yes  Lifestyle  . Physical activity    Days per week: Not on file    Minutes per session: Not on file  . Stress: Not on file  Relationships  . Social Herbalist on phone: Not on file    Gets together: Not on file    Attends religious service: Not on file    Active  member of club or organization: Not on file    Attends meetings of clubs or organizations: Not on file    Relationship status: Not on file  Other Topics Concern  . Not on file  Social History Narrative  . Not on file    Family History: Family History  Problem Relation Age of Onset  . Asthma Brother     Allergies: Allergies  Allergen Reactions  . Penicillins Hives    Medications Prior to Admission  Medication Sig Dispense Refill Last Dose  . Prenatal Vit-Fe Fumarate-FA (PRENATAL MULTIVITAMIN) TABS Take 1 tablet by mouth at bedtime.   08/01/2018 at Unknown time     Review of Systems  All systems reviewed and negative except as stated in HPI  Physical Exam Blood pressure 105/76, pulse 99, temperature 98.2 F (36.8 C), resp. rate 12, SpO2 95 %, unknown if currently breastfeeding. General appearance: Alert, oriented, NAD Lungs: Normal respiratory effort Heart: Regular rate Abdomen: Soft, non-tender; gravid, FH appropriate for GA Extremities: No calf swelling or tenderness Presentation: Cephalic by bedside US Fetal monitoring: Baseline 135, moderate variability, +  accels, -decels Uterine activity: Contractions every 2-3 minutes Dilation: 1.5 Effacement (%): 90 Exam by:: Zenia ResidesNikki Risheq, RN  Prenatal labs: ABO, Rh:  O POS Antibody:  Negative Rubella:  Immune RPR:  NR HBsAg:  Negative HIV:  NR GC/Chlamydia: Negative  GBS:  Negative 1-hr GTT: 117 Genetic screening: Quad screen negative Anatomy US: No gross fetal anomalies; noted to have fluid distended left renal pelvis and unable to fully visualize 4 chamber heat (repeat US not obtained); placenta posterior  Prenatal Transfer Tool  Maternal Diabetes: No Genetic Screening: Normal Maternal Ultrasounds/Referrals: As above Fetal Ultrasounds or other Referrals:  As above Maternal Substance Abuse:  No Significant Maternal Medications:  None Significant Maternal Lab Results: Group B Strep negative  Results for orders  placed or performed during the hospital encounter of 08/02/18 (from the past 24 hour(s))  The PepsiFern Test   Collection Time: 08/02/18  6:21 PM  Result Value Ref Range   POCT Fern Test Positive = ruptured amniotic membanes     There are no active problems to display for this patient.   Assessment: Gabriela Willis is a 33 y.o. G3P1011 at 6362w6d here for spontaneous rupture of membranes.  #Labor:  Hx of C-section; desires TOLAC and consented on admission. Cephalic confirmed via bedside US in MAU. Patient is contracting every 2-3 minutes with SROM at 1700. Will manage expectantly for now and reassess in 4 hours. #Pain: Desires epidural #FWB:  Reactive tracing as above #ID:  GBS negative; no antibiotics indicated #MOF:  Breast and bottle #MOC:  OCPs #Circ:  Declined  Worthy RancherChristina M Albert 08/02/2018, 7:02 PM  I spoke with and examined patient and agree with resident/PA-S/MS/SNM's note and plan of care.  Cheral MarkerKimberly R. Dyami Umbach, CNM, [facility]NP-BC 08/02/2018 10:58 PM

## 2018-08-02 NOTE — Progress Notes (Signed)
Pt informed that the ultrasound is considered a limited OB ultrasound and is not intended to be a complete ultrasound exam.  Patient also informed that the ultrasound is not being completed with the intent of assessing for fetal or placental anomalies or any pelvic abnormalities.  Explained that the purpose of today's ultrasound is to assess for  presentation.  Patient acknowledges the purpose of the exam and the limitations of the study.    Cephalic  Gabriela Breitenstein, NP   

## 2018-08-02 NOTE — Discharge Instructions (Signed)
Contracciones de Braxton Hicks Braxton Hicks Contractions Las contracciones del tero pueden presentarse durante todo el embarazo, pero no siempre indican que la mujer est de parto. Es posible que usted haya tenido contracciones de prctica llamadas "contracciones de Braxton Hicks". A veces, se las confunde con el parto real. Qu son las contracciones de Braxton Hicks? Las contracciones de Braxton Hicks son espasmos que se producen en los msculos del tero antes del parto. A diferencia de las contracciones del parto verdadero, estas no producen el agrandamiento (la dilatacin) ni el afinamiento del cuello uterino. Hacia el final del embarazo (entre las semanas 32y34), las contracciones de Braxton Hicks pueden presentarse ms seguido y tornarse ms intensas. A veces, resulta difcil distinguirlas del parto verdadero porque pueden ser muy molestas. No debe sentirse avergonzada si concurre al hospital con falso parto. En ocasiones, la nica forma de saber si el trabajo de parto es verdadero es que el mdico determine si hay cambios en el cuello del tero. El mdico le har un examen fsico y quizs le controle las contracciones. Si usted no est de parto verdadero, el examen debe indicar que el cuello uterino no est dilatado y que usted no ha roto bolsa. Si no hay otros problemas de salud asociados con su embarazo, no habr inconvenientes si la envan a su casa con un falso parto. Es posible que las contracciones de Braxton Hicks continen hasta que se desencadene el parto verdadero. Cmo diferenciar el trabajo de parto falso del verdadero Trabajo de parto verdadero  Las contracciones duran de 30a70segundos.  Las contracciones pueden tornarse muy regulares.  La molestia generalmente se siente en la parte superior del tero y se extiende hacia la zona baja del abdomen y hacia la cintura.  Las contracciones no desaparecen cuando usted camina.  Las contracciones generalmente se hacen ms  intensas y aumentan en frecuencia.  El cuello uterino se dilata y se afina. Parto falso  En general, las contracciones son ms cortas y no tan intensas como las del parto verdadero.  En general, las contracciones son irregulares.  A menudo, las contracciones se sienten en la parte delantera de la parte baja del abdomen y en la ingle.  Las contracciones pueden desaparecer cuando usted camina o cambia de posicin mientras est acostada.  Las contracciones se vuelven ms dbiles y su duracin es menor a medida que transcurre el tiempo.  En general, el cuello uterino no se dilata ni se afina. Siga estas indicaciones en su casa:   Tome los medicamentos de venta libre y los recetados solamente como se lo haya indicado el mdico.  Contine haciendo los ejercicios habituales y siga las dems indicaciones que el mdico le d.  Coma y beba con moderacin si cree que est de parto.  Si las contracciones de Braxton Hicks le provocan incomodidad: ? Cambie de posicin: si est acostada o descansando, camine; si est caminando, descanse. ? Sintese y descanse en una baera con agua tibia. ? Beba suficiente lquido como para mantener la orina de color amarillo plido. La deshidratacin puede provocar contracciones. ? Respire lenta y profundamente varias veces por hora.  Vaya a todas las visitas de control prenatales y de control como se lo haya indicado el mdico. Esto es importante. Comunquese con un mdico si:  Tiene fiebre.  Siente dolor constante en el abdomen. Solicite ayuda de inmediato si:  Las contracciones se intensifican, se hacen ms regulares y cercanas entre s.  Tiene una prdida de lquido por la vagina.  Elimina   una mucosidad sanguinolenta (prdida del tapn mucoso).  Tiene una hemorragia vaginal.  Tiene un dolor en la zona lumbar que nunca tuvo antes.  Siente que la cabeza del beb empuja hacia abajo y ejerce presin en la zona plvica.  El beb no se mueve tanto  como antes. Resumen  Las contracciones que se presentan antes del parto se conocen como contracciones de Braxton Hicks, falso parto o contracciones de prctica.  En general, las contracciones de Braxton Hicks son ms cortas, ms dbiles, con ms tiempo entre una y otra, y menos regulares que las contracciones del parto verdadero. Las contracciones del parto verdadero se intensifican progresivamente y se tornan regulares y ms frecuentes.  Para controlar la molestia que producen las contracciones de Braxton Hicks, puede cambiar de posicin, darse un bao templado y descansar, beber mucha agua o practicar la respiracin profunda. Esta informacin no tiene como fin reemplazar el consejo del mdico. Asegrese de hacerle al mdico cualquier pregunta que tenga. Document Released: 08/17/2016 Document Revised: 04/16/2017 Document Reviewed: 08/17/2016 Elsevier Patient Education  2020 Elsevier Inc.  

## 2018-08-02 NOTE — Anesthesia Procedure Notes (Signed)
Epidural Patient location during procedure: OB Start time: 08/02/2018 11:58 PM End time: 08/03/2018 12:08 AM  Staffing Anesthesiologist: Barnet Glasgow, MD Performed: anesthesiologist   Preanesthetic Checklist Completed: patient identified, site marked, surgical consent, pre-op evaluation, timeout performed, IV checked, risks and benefits discussed and monitors and equipment checked  Epidural Patient position: sitting Prep: site prepped and draped and DuraPrep Patient monitoring: continuous pulse ox and blood pressure Approach: midline Location: L3-L4 Injection technique: LOR air  Needle:  Needle type: Tuohy  Needle gauge: 17 G Needle length: 9 cm and 9 Needle insertion depth: 6 cm Catheter type: closed end flexible Catheter size: 19 Gauge Catheter at skin depth: 11 cm Test dose: negative  Assessment Events: blood not aspirated, injection not painful, no injection resistance, negative IV test and no paresthesia  Additional Notes Patient identified. Risks/Benefits/Options discussed with patient including but not limited to bleeding, infection, nerve damage, paralysis, failed block, incomplete pain control, headache, blood pressure changes, nausea, vomiting, reactions to medication both or allergic, itching and postpartum back pain. Confirmed with bedside nurse the patient's most recent platelet count. Confirmed with patient that they are not currently taking any anticoagulation, have any bleeding history or any family history of bleeding disorders. Patient expressed understanding and wished to proceed. All questions were answered. Sterile technique was used throughout the entire procedure. Please see nursing notes for vital signs. Test dose was given through epidural needle and negative prior to continuing to dose epidural or start infusion. Warning signs of high block given to the patient including shortness of breath, tingling/numbness in hands, complete motor block, or any  concerning symptoms with instructions to call for help. Patient was given instructions on fall risk and not to get out of bed. All questions and concerns addressed with instructions to call with any issues. 1 Attempt (S) . Patient tolerated procedure well.

## 2018-08-02 NOTE — MAU Provider Note (Addendum)
Labor Check:  Discussed patient seen by RN in MAU. Gabriela Willis is a 32 yo G3P1011 at [redacted]w[redacted]d presenting for a labor evaluation due to contractions. Pregnancy uncomplicated, planning for TOLAC. Cervical exam 1/40/posterior, repeat unchanged 1 hour later. Vitals stable, BP wnl. Strip reactive, maternal contractions irregular every 5-7 mins with mild discomfort.   Planned for discharge, however patient now consistently contracting every 3 minutes. Will monitor for an additional hour if patient willing especially given multip and attempting TOLAC.  UPDATE: Recheck 1 hour later, minimal cervical change. Strip still reactive and reassuring. Contractions every 3-6 min, in which patient endorses unchanged for the past 12 hours. Will discharge home with strict return precautions.   [name], DO   CNM DISCHARGE ATTESTATION  I have reviewed the tracing and RN note and agree with above documentation in the resident's note.   [name], [facility] 08/02/2018

## 2018-08-02 NOTE — Progress Notes (Signed)
Patient ID: Gabriela Willis, female   DOB: Jul 24, 1985, 33 y.o.   MRN: [id number] Gabriela Willis is a 33 y.o. G3P1011 at [redacted]w[redacted]d admitted for PROM/early labor  Subjective: Uncomfortable w/ uc's, wants epidural  Objective: BP 124/76   Pulse 73   Temp 98.6 F (37 C) (Oral)   Resp 16   Wt 75.1 kg   SpO2 95%   BMI 32.32 kg/m  No intake/output data recorded.  FHT:  FHR: 145 bpm, variability: moderate,  accelerations:  Present,  decelerations:  Absent UC:   regular, every 1-4 minutes  SVE:   Dilation: 4 Effacement (%): 100 Station: -1 Exam by:: Booker, K CNM  AROM bulging forebag, mod amt clear fluid  Labs: Lab Results  Component Value Date   WBC 10.7 (H) 08/02/2018   HGB 14.0 08/02/2018   HCT 43.3 08/02/2018   MCV 85.9 08/02/2018   PLT 223 08/02/2018    Assessment / Plan: Spontaneous labor, progressing normally, AROM'd forebag  Labor: Progressing normally Fetal Wellbeing:  Category I Pain Control:  wants epidural Pre-eclampsia: n/a I/D:  GBS neg Anticipated MOD:  VBAC  Roma Schanz CNM, WHNP-BC 08/02/2018, 11:36 PM

## 2018-08-02 NOTE — MAU Note (Signed)
Upon discharging pt., pt informed RN that she fell 2 days prior, but did not seek medical attention.  MD notified of patient's fall, MD reassured by [place] tracing during stay in MAU. Pt then verbalized that she was considering a repeat c-section.  Attending MD gave pt option to have her c-section done later today, or to be dc home and continue her plan of TOLAC.  Both options were presented to patient with spanish int. Present.  Pt verbalized that she would like to go home and continue her plan to [facility].  Pt. Educated on reasons to return to MAU.

## 2018-08-02 NOTE — Anesthesia Preprocedure Evaluation (Signed)
Anesthesia Evaluation  Patient identified by MRN, date of birth, ID band Patient awake    Reviewed: Allergy & Precautions, NPO status , Patient's Chart, lab work & pertinent test results  Airway Mallampati: II  TM Distance: >3 FB Neck ROM: Full    Dental no notable dental hx.    Pulmonary neg pulmonary ROS,    Pulmonary exam normal breath sounds clear to auscultation       Cardiovascular Exercise Tolerance: Good negative cardio ROS Normal cardiovascular exam Rhythm:Regular Rate:Normal     Neuro/Psych negative neurological ROS     GI/Hepatic   Endo/Other  negative endocrine ROS  Renal/GU negative Renal ROS     Musculoskeletal   Abdominal   Peds  Hematology Hgb 14 Plt 223   Anesthesia Other Findings Pt Spanish speaking  Reproductive/Obstetrics (+) Pregnancy                             Anesthesia Physical Anesthesia Plan  ASA: II  Anesthesia Plan: Epidural   Post-op Pain Management:    Induction:   PONV Risk Score and Plan:   Airway Management Planned:   Additional Equipment:   Intra-op Plan:   Post-operative Plan:   Informed Consent: I have reviewed the patients History and Physical, chart, labs and discussed the procedure including the risks, benefits and alternatives for the proposed anesthesia with the patient or authorized representative who has indicated his/her understanding and acceptance.       Plan Discussed with:   Anesthesia Plan Comments: (39,6 Wk G3P1 for LEA )        Anesthesia Quick Evaluation

## 2018-08-02 NOTE — MAU Note (Signed)
I have communicated with Dr. Higinio Plan and reviewed vital signs:  Vitals:   08/02/18 0553  BP: 116/74  Pulse: 73  Resp: 18  Temp: 98.2 F (36.8 C)    Vaginal exam:  Dilation: 1.5 Effacement (%): 50 Cervical Position: Posterior Presentation: Undeterminable Exam by:: Fredda Hammed ,   Also reviewed contraction pattern and that non-stress test is reactive.  It has been documented that patient is contracting every 2-4 minutes with minimal cervical change over 2 hours not indicating active labor.  Patient denies any other complaints.  Based on this report provider has given order for discharge.  A discharge order and diagnosis entered by a provider.   Labor discharge instructions reviewed with patient.

## 2018-08-02 NOTE — MAU Note (Signed)
PT SAYS WITH INTERPRETER ON STRATUS-  UC STRONG SINCE 0300.  PNC WITH HD. VE - TODAY  1 CM.  DENIES HSV AND MRSA. GBS- NEG.

## 2018-08-02 NOTE — MAU Note (Signed)
Pt states she thinks her water broke at 5 pm. Pt states the fluid was clear. Pt states she is only leaking a little bit.   Pt reports +fm  Denies vaginal bleeding.

## 2018-08-03 ENCOUNTER — Encounter (HOSPITAL_COMMUNITY): Payer: Self-pay | Admitting: *Deleted

## 2018-08-03 ENCOUNTER — Encounter (HOSPITAL_COMMUNITY)
Admission: AD | Disposition: A | Discharge status: Home / Self Care | Payer: Self-pay | Source: Home / Self Care | Attending: Obstetrics and Gynecology

## 2018-08-03 LAB — CBC
HCT: 35.7 % — ABNORMAL LOW (ref 36.0–46.0)
Hemoglobin: 11.9 g/dL — ABNORMAL LOW (ref 12.0–15.0)
MCH: 28.1 pg (ref 26.0–34.0)
MCHC: 33.3 g/dL (ref 30.0–36.0)
MCV: 84.4 fL (ref 80.0–100.0)
Platelets: 170 10*3/uL (ref 150–400)
RBC: 4.23 MIL/uL (ref 3.87–5.11)
RDW: 15.4 % (ref 11.5–15.5)
WBC: 17.4 10*3/uL — ABNORMAL HIGH (ref 4.0–10.5)
nRBC: 0 % (ref 0.0–0.2)

## 2018-08-03 LAB — ABO/RH: ABO/RH(D): O POS

## 2018-08-03 LAB — CREATININE, SERUM
Creatinine, Ser: 0.68 mg/dL (ref 0.44–1.00)
GFR calc Af Amer: >60 mL/min (ref >60)
GFR calc non Af Amer: >60 mL/min (ref >60)

## 2018-08-03 LAB — RPR: RPR Ser Ql: NONREACTIVE

## 2018-08-03 SURGERY — Surgical Case
Anesthesia: Epidural | Site: Abdomen | Wound class: Clean Contaminated

## 2018-08-03 MED ORDER — DIPHENHYDRAMINE HCL 50 MG/ML IJ SOLN: 12.5000 mg | INTRAMUSCULAR | Status: DC | PRN

## 2018-08-03 MED ORDER — NALBUPHINE HCL 10 MG/ML IJ SOLN: 5.0000 mg | Freq: Once | INTRAMUSCULAR | Status: DC | PRN

## 2018-08-03 MED ORDER — LIDOCAINE-EPINEPHRINE (PF) 2 %-1:200000 IJ SOLN
INTRAMUSCULAR | Status: AC
  Filled 2018-08-03: qty 10

## 2018-08-03 MED ORDER — ENOXAPARIN SODIUM 40 MG/0.4ML ~~LOC~~ SOLN
40.0000 mg | SUBCUTANEOUS | Status: DC
  Administered 2018-08-04 – 2018-08-05 (×2): 40 mg via SUBCUTANEOUS
  Filled 2018-08-03 (×2): qty 0.4

## 2018-08-03 MED ORDER — COCONUT OIL OIL: 1.0000 "application " | TOPICAL_OIL | Status: DC | PRN

## 2018-08-03 MED ORDER — MEPERIDINE HCL 25 MG/ML IJ SOLN: 6.2500 mg | INTRAMUSCULAR | Status: DC | PRN

## 2018-08-03 MED ORDER — SCOPOLAMINE 1 MG/3DAYS TD PT72
1.0000 | MEDICATED_PATCH | Freq: Once | TRANSDERMAL | Status: DC
  Administered 2018-08-03: 1.5 mg via TRANSDERMAL
  Filled 2018-08-03: qty 1

## 2018-08-03 MED ORDER — WITCH HAZEL-GLYCERIN EX PADS: 1.0000 "application " | MEDICATED_PAD | CUTANEOUS | Status: DC | PRN

## 2018-08-03 MED ORDER — NALOXONE HCL 0.4 MG/ML IJ SOLN: 0.4000 mg | INTRAMUSCULAR | Status: DC | PRN

## 2018-08-03 MED ORDER — SIMETHICONE 80 MG PO CHEW: 80.0000 mg | CHEWABLE_TABLET | ORAL | Status: DC | PRN

## 2018-08-03 MED ORDER — OXYTOCIN 40 UNITS IN NORMAL SALINE INFUSION - SIMPLE MED
1.0000 m[IU]/min | INTRAVENOUS | Status: DC
  Administered 2018-08-03 (×3): 2 m[IU]/min via INTRAVENOUS
  Filled 2018-08-03: qty 1000

## 2018-08-03 MED ORDER — ACETAMINOPHEN 325 MG PO TABS: 325.0000 mg | ORAL_TABLET | Freq: Once | ORAL | Status: DC | PRN

## 2018-08-03 MED ORDER — ONDANSETRON HCL 4 MG/2ML IJ SOLN: 4.0000 mg | Freq: Three times a day (TID) | INTRAMUSCULAR | Status: DC | PRN

## 2018-08-03 MED ORDER — OXYCODONE-ACETAMINOPHEN 5-325 MG PO TABS: 1.0000 | ORAL_TABLET | ORAL | Status: DC | PRN

## 2018-08-03 MED ORDER — SIMETHICONE 80 MG PO CHEW
80.0000 mg | CHEWABLE_TABLET | Freq: Three times a day (TID) | ORAL | Status: DC
  Administered 2018-08-03 – 2018-08-05 (×6): 80 mg via ORAL
  Filled 2018-08-03 (×5): qty 1

## 2018-08-03 MED ORDER — KETOROLAC TROMETHAMINE 30 MG/ML IJ SOLN
30.0000 mg | Freq: Four times a day (QID) | INTRAMUSCULAR | Status: AC | PRN
  Administered 2018-08-03: 30 mg via INTRAVENOUS
  Filled 2018-08-03: qty 1

## 2018-08-03 MED ORDER — NALBUPHINE HCL 10 MG/ML IJ SOLN: 5.0000 mg | INTRAMUSCULAR | Status: DC | PRN

## 2018-08-03 MED ORDER — MORPHINE SULFATE (PF) 0.5 MG/ML IJ SOLN
INTRAMUSCULAR | Status: AC
  Filled 2018-08-03: qty 10

## 2018-08-03 MED ORDER — SODIUM CHLORIDE 0.9 % IR SOLN
Status: DC | PRN
  Administered 2018-08-03: 1

## 2018-08-03 MED ORDER — SODIUM CHLORIDE 0.9% FLUSH: 3.0000 mL | INTRAVENOUS | Status: DC | PRN

## 2018-08-03 MED ORDER — KETOROLAC TROMETHAMINE 30 MG/ML IJ SOLN
30.0000 mg | Freq: Four times a day (QID) | INTRAMUSCULAR | Status: AC | PRN
  Administered 2018-08-03: 30 mg via INTRAMUSCULAR

## 2018-08-03 MED ORDER — PRENATAL MULTIVITAMIN CH
1.0000 | ORAL_TABLET | Freq: Every day | ORAL | Status: DC
  Administered 2018-08-04 – 2018-08-05 (×2): 1 via ORAL
  Filled 2018-08-03 (×2): qty 1

## 2018-08-03 MED ORDER — LACTATED RINGERS IV SOLN
INTRAVENOUS | Status: DC
  Administered 2018-08-03 – 2018-08-04 (×2): via INTRAVENOUS

## 2018-08-03 MED ORDER — MORPHINE SULFATE (PF) 0.5 MG/ML IJ SOLN
INTRAMUSCULAR | Status: DC | PRN
  Administered 2018-08-03: 2.5 mg via INTRATHECAL

## 2018-08-03 MED ORDER — OXYTOCIN 40 UNITS IN NORMAL SALINE INFUSION - SIMPLE MED: 2.5000 [IU]/h | INTRAVENOUS | Status: AC

## 2018-08-03 MED ORDER — TETANUS-DIPHTH-ACELL PERTUSSIS 5-2.5-18.5 LF-MCG/0.5 IM SUSP: 0.5000 mL | Freq: Once | INTRAMUSCULAR | Status: DC

## 2018-08-03 MED ORDER — PROMETHAZINE HCL 25 MG/ML IJ SOLN: 6.2500 mg | INTRAMUSCULAR | Status: DC | PRN

## 2018-08-03 MED ORDER — NALOXONE HCL 4 MG/10ML IJ SOLN
1.0000 ug/kg/h | INTRAVENOUS | Status: DC | PRN
  Filled 2018-08-03: qty 5

## 2018-08-03 MED ORDER — ACETAMINOPHEN 10 MG/ML IV SOLN: 1000.0000 mg | Freq: Once | INTRAVENOUS | Status: DC | PRN

## 2018-08-03 MED ORDER — SIMETHICONE 80 MG PO CHEW
80.0000 mg | CHEWABLE_TABLET | ORAL | Status: DC
  Administered 2018-08-04: 80 mg via ORAL
  Filled 2018-08-03 (×2): qty 1

## 2018-08-03 MED ORDER — FENTANYL CITRATE (PF) 100 MCG/2ML IJ SOLN: 25.0000 ug | INTRAMUSCULAR | Status: DC | PRN

## 2018-08-03 MED ORDER — GABAPENTIN 100 MG PO CAPS
100.0000 mg | ORAL_CAPSULE | Freq: Three times a day (TID) | ORAL | Status: DC
  Administered 2018-08-04 – 2018-08-05 (×4): 100 mg via ORAL
  Filled 2018-08-03 (×4): qty 1

## 2018-08-03 MED ORDER — ACETAMINOPHEN 160 MG/5ML PO SOLN: 325.0000 mg | Freq: Once | ORAL | Status: DC | PRN

## 2018-08-03 MED ORDER — LACTATED RINGERS IV SOLN: INTRAVENOUS | Status: DC

## 2018-08-03 MED ORDER — FENTANYL CITRATE (PF) 100 MCG/2ML IJ SOLN
INTRAMUSCULAR | Status: AC
  Filled 2018-08-03: qty 2

## 2018-08-03 MED ORDER — TERBUTALINE SULFATE 1 MG/ML IJ SOLN: 0.2500 mg | Freq: Once | INTRAMUSCULAR | Status: DC | PRN

## 2018-08-03 MED ORDER — DIPHENHYDRAMINE HCL 25 MG PO CAPS: 25.0000 mg | ORAL_CAPSULE | ORAL | Status: DC | PRN

## 2018-08-03 MED ORDER — ONDANSETRON HCL 4 MG/2ML IJ SOLN
INTRAMUSCULAR | Status: AC
  Filled 2018-08-03: qty 2

## 2018-08-03 MED ORDER — MENTHOL 3 MG MT LOZG: 1.0000 | LOZENGE | OROMUCOSAL | Status: DC | PRN

## 2018-08-03 MED ORDER — GENTAMICIN SULFATE 40 MG/ML IJ SOLN
5.0000 mg/kg | INTRAVENOUS | Status: AC
  Administered 2018-08-03: 14:00:00 290 mg via INTRAVENOUS
  Filled 2018-08-03: qty 7.25

## 2018-08-03 MED ORDER — SENNOSIDES-DOCUSATE SODIUM 8.6-50 MG PO TABS
2.0000 | ORAL_TABLET | ORAL | Status: DC
  Administered 2018-08-04: 2 via ORAL
  Filled 2018-08-03 (×2): qty 2

## 2018-08-03 MED ORDER — LIDOCAINE HCL (PF) 1 % IJ SOLN
INTRAMUSCULAR | Status: DC | PRN
  Administered 2018-08-03: 5 mL via EPIDURAL

## 2018-08-03 MED ORDER — IBUPROFEN 800 MG PO TABS
800.0000 mg | ORAL_TABLET | Freq: Three times a day (TID) | ORAL | Status: DC
  Administered 2018-08-04 – 2018-08-05 (×4): 800 mg via ORAL
  Filled 2018-08-03 (×5): qty 1

## 2018-08-03 MED ORDER — CLINDAMYCIN PHOSPHATE 900 MG/50ML IV SOLN
900.0000 mg | INTRAVENOUS | Status: AC
  Administered 2018-08-03: 900 mg via INTRAVENOUS
  Filled 2018-08-03: qty 50

## 2018-08-03 MED ORDER — DIPHENHYDRAMINE HCL 25 MG PO CAPS: 25.0000 mg | ORAL_CAPSULE | Freq: Four times a day (QID) | ORAL | Status: DC | PRN

## 2018-08-03 MED ORDER — LIDOCAINE-EPINEPHRINE (PF) 2 %-1:200000 IJ SOLN
INTRAMUSCULAR | Status: DC | PRN
  Administered 2018-08-03: 6 mL via EPIDURAL
  Administered 2018-08-03: 2 mL via EPIDURAL

## 2018-08-03 MED ORDER — DIBUCAINE (PERIANAL) 1 % EX OINT: 1.0000 "application " | TOPICAL_OINTMENT | CUTANEOUS | Status: DC | PRN

## 2018-08-03 MED ORDER — KETOROLAC TROMETHAMINE 30 MG/ML IJ SOLN
INTRAMUSCULAR | Status: AC
  Filled 2018-08-03: qty 1

## 2018-08-03 MED ORDER — SODIUM CHLORIDE (PF) 0.9 % IJ SOLN
INTRAMUSCULAR | Status: DC | PRN
  Administered 2018-08-03: 12 mL/h via EPIDURAL

## 2018-08-03 MED ORDER — STERILE WATER FOR IRRIGATION IR SOLN
Status: DC | PRN
  Administered 2018-08-03: 1

## 2018-08-03 SURGICAL SUPPLY — 30 items
BENZOIN TINCTURE PRP APPL 2/3 (GAUZE/BANDAGES/DRESSINGS) ×2 IMPLANT
CHLORAPREP W/TINT 26ML (MISCELLANEOUS) ×2 IMPLANT
CLAMP CORD UMBIL (MISCELLANEOUS) IMPLANT
DRSG OPSITE POSTOP 4X10 (GAUZE/BANDAGES/DRESSINGS) ×2 IMPLANT
ELECT REM PT RETURN 9FT ADLT (ELECTROSURGICAL) ×2
ELECTRODE REM PT RTRN 9FT ADLT (ELECTROSURGICAL) ×1 IMPLANT
EXTRACTOR VACUUM M CUP 4 TUBE (SUCTIONS) IMPLANT
GLOVE BIOGEL PI IND STRL 6.5 (GLOVE) ×1 IMPLANT
GLOVE BIOGEL PI IND STRL 7.0 (GLOVE) ×1 IMPLANT
GLOVE BIOGEL PI INDICATOR 6.5 (GLOVE) ×1
GLOVE BIOGEL PI INDICATOR 7.0 (GLOVE) ×1
GLOVE SURG SS PI 6.0 STRL IVOR (GLOVE) ×2 IMPLANT
GOWN STRL REUS W/TWL LRG LVL3 (GOWN DISPOSABLE) ×4 IMPLANT
KIT ABG SYR 3ML LUER SLIP (SYRINGE) IMPLANT
NEEDLE HYPO 25X5/8 SAFETYGLIDE (NEEDLE) IMPLANT
NS IRRIG 1000ML POUR BTL (IV SOLUTION) ×2 IMPLANT
PACK C SECTION WH (CUSTOM PROCEDURE TRAY) ×2 IMPLANT
PAD ABD 7.5X8 STRL (GAUZE/BANDAGES/DRESSINGS) ×2 IMPLANT
PAD OB MATERNITY 4.3X12.25 (PERSONAL CARE ITEMS) ×2 IMPLANT
PENCIL SMOKE EVAC W/HOLSTER (ELECTROSURGICAL) ×2 IMPLANT
RTRCTR C-SECT PINK 25CM LRG (MISCELLANEOUS) IMPLANT
SEPRAFILM MEMBRANE 5X6 (MISCELLANEOUS) IMPLANT
SPONGE GAUZE 4X4 12PLY STER LF (GAUZE/BANDAGES/DRESSINGS) ×4 IMPLANT
STRIP CLOSURE SKIN 1/2X4 (GAUZE/BANDAGES/DRESSINGS) ×2 IMPLANT
SUT PLAIN 0 NONE (SUTURE) IMPLANT
SUT VIC AB 0 CT1 36 (SUTURE) ×8 IMPLANT
SUT VIC AB 4-0 KS 27 (SUTURE) ×2 IMPLANT
TOWEL OR 17X24 6PK STRL BLUE (TOWEL DISPOSABLE) ×2 IMPLANT
TRAY FOLEY W/BAG SLVR 14FR LF (SET/KITS/TRAYS/PACK) ×2 IMPLANT
WATER STERILE IRR 1000ML POUR (IV SOLUTION) ×2 IMPLANT

## 2018-08-03 NOTE — Progress Notes (Signed)
Stratus used. Juliann Pulse [phone]

## 2018-08-03 NOTE — Anesthesia Postprocedure Evaluation (Signed)
Anesthesia Post Note  Patient: Gabriela Willis  Procedure(s) Performed: CESAREAN SECTION (N/A Abdomen)     Patient location during evaluation: PACU Anesthesia Type: Epidural Level of consciousness: awake and alert Pain management: pain level controlled Vital Signs Assessment: post-procedure vital signs reviewed and stable Respiratory status: spontaneous breathing, nonlabored ventilation and respiratory function stable Cardiovascular status: stable Postop Assessment: no headache, no backache and epidural receding Anesthetic complications: no    Last Vitals:  Vitals:   08/03/18 1542 08/03/18 1556  BP:  108/72  Pulse: 83 75  Resp: 18 20  Temp:  36.7 C  SpO2:  100%    Last Pain:  Vitals:   08/03/18 1556  TempSrc: Oral  PainSc:    Pain Goal:                Epidural/Spinal Function Cutaneous sensation: Able to Discern Pressure (08/03/18 1545), Patient able to flex knees: Yes (08/03/18 1545), Patient able to lift hips off bed: Yes (08/03/18 1545), Back pain beyond tenderness at insertion site: No (08/03/18 1545), Progressively worsening motor and/or sensory loss: No (08/03/18 1545), Bowel and/or bladder incontinence post epidural: No (08/03/18 1545)  Effie Berkshire

## 2018-08-03 NOTE — Progress Notes (Signed)
Patient ID: Gabriela Willis, female   DOB: 05/30/1985, 33 y.o.   MRN: [id number] Gabriela Willis is a 33 y.o. G3P1011 at [redacted]w[redacted]d admitted for rupture of membranes, early labor  Subjective: Doing well  Objective: BP (!) 103/59   Pulse 90   Temp 98.6 F (37 C) (Oral)   Resp 16   Wt 75.1 kg   SpO2 95%   BMI 32.32 kg/m  Total I/O In: -  Out: 250 [Urine:250]  FHT:  FHR: 135 bpm, variability: moderate,  accelerations:  Present,  decelerations:  Absent UC:   q 4-37min  SVE:   Dilation: 5 Effacement (%): 100 Station: -1, 0 Exam by:: Company secretary  Pitocin @ 2 mu/min  Labs: Lab Results  Component Value Date   WBC 10.7 (H) 08/02/2018   HGB 14.0 08/02/2018   HCT 43.3 08/02/2018   MCV 85.9 08/02/2018   PLT 223 08/02/2018    Assessment / Plan: SROM/SOL, had started pitocin augmentation earlier d/t spacing out uc's, however had 2 variables/prolonged decels so pit was stopped, pit just restarted  Labor: augmentation Fetal Wellbeing:  Category I Pain Control:  Epidural Pre-eclampsia: n/a I/D:  GBS neg Anticipated MOD:  [place], WHNP-BC 08/03/2018, [phone]

## 2018-08-03 NOTE — Transfer of Care (Signed)
Immediate Anesthesia Transfer of Care Note  Patient: Gabriela Willis  Procedure(s) Performed: CESAREAN SECTION (N/A Abdomen)  Patient Location: PACU  Anesthesia Type:Epidural  Level of Consciousness: awake, alert  and oriented  Airway & Oxygen Therapy: Patient Spontanous Breathing and Patient connected to nasal cannula oxygen  Post-op Assessment: Report given to RN  Post vital signs: Reviewed and stable  Last Vitals:  Vitals Value Taken Time  BP    Temp    Pulse 91 08/03/18 1436  Resp 28 08/03/18 1436  SpO2 100 % 08/03/18 1436  Vitals shown include unvalidated device data.  Last Pain:  Vitals:   08/03/18 1301  TempSrc:   PainSc: 0-No pain         Complications: No apparent anesthesia complications

## 2018-08-03 NOTE — Progress Notes (Signed)
Patient ID: Gabriela Willis, female   DOB: 07-Dec-1985, 33 y.o.   MRN: [id number] Gabriela Willis is a 33 y.o. G3P1011 at [redacted]w[redacted]d admitted for PROM/early labor  Subjective: Comfortable w/ epidural  Objective: BP 109/69   Pulse 89   Temp 98.8 F (37.1 C) (Oral)   Resp 18   Wt 75.1 kg   SpO2 95%   BMI 32.32 kg/m  No intake/output data recorded.  FHT:  FHR: 150 bpm, variability: moderate,  accelerations:  Present,  decelerations:  Present had recurrent lates, pitocin stopped UC:   q 1-5  SVE:   Dilation: 4.5 Effacement (%): 100 Station: -1, 0 Exam by:: Rosana Hoes, RN  Pitocin OFF @ [phone] after recurrent lates, had just been increased to 4  Labs: Lab Results  Component Value Date   WBC 10.7 (H) 08/02/2018   HGB 14.0 08/02/2018   HCT 43.3 08/02/2018   MCV 85.9 08/02/2018   PLT 223 08/02/2018    Assessment / Plan: Augmentation of labor d/t protracted labor, pit currently off d/t decels, improved immediately after stopping pit. Will update oncoming team  Labor: protracted labor Fetal Wellbeing:  Category II Pain Control:  Epidural Pre-eclampsia: n/a I/D:  GBS neg Anticipated MOD:  TBD  Roma Schanz CNM, WHNP-BC 08/03/2018, 8:09 AM

## 2018-08-03 NOTE — Op Note (Signed)
Gabriela Willis PROCEDURE DATE: 08/02/2018 - 08/03/2018  PREOPERATIVE DIAGNOSIS: Intrauterine pregnancy at  [redacted]w[redacted]d weeks gestation; non-reassuring fetal status  POSTOPERATIVE DIAGNOSIS: The same  PROCEDURE:     Cesarean Section  SURGEON:  Dr. Mora Bellman  ASSISTANT: none  INDICATIONS: Gabriela Willis is a 33 y.o. G3P1011 at [redacted]w[redacted]d scheduled for cesarean section secondary to non-reassuring fetal status.  The risks of cesarean section discussed with the patient included but were not limited to: bleeding which may require transfusion or reoperation; infection which may require antibiotics; injury to bowel, bladder, ureters or other surrounding organs; injury to the fetus; need for additional procedures including hysterectomy in the event of a life-threatening hemorrhage; placental abnormalities wth subsequent pregnancies, incisional problems, thromboembolic phenomenon and other postoperative/anesthesia complications. The patient concurred with the proposed plan, giving informed written consent for the procedure.    FINDINGS:  Viable female infant in cephalic presentation.  Apgars 8 and 9. Clear amniotic fluid.  Intact placenta, three vessel cord.  Normal uterus with some omental adhesion on the anterior uterine body, fallopian tubes and ovaries bilaterally.  ANESTHESIA:    Spinal INTRAVENOUS FLUIDS:2300 ml ESTIMATED BLOOD LOSS: 150 ml URINE OUTPUT:  75 ml SPECIMENS: Placenta sent to L&D COMPLICATIONS: None immediate  PROCEDURE IN DETAIL:  The patient received intravenous antibiotics and had sequential compression devices applied to her lower extremities while in the preoperative area.  She was then taken to the operating room where anesthesia was induced and was found to be adequate. A foley catheter was placed into her bladder and attached to Gabriela Willis gravity. She was then placed in a dorsal supine position with a leftward tilt, and prepped and draped in a sterile manner. After an  adequate timeout was performed, a Pfannenstiel skin incision was made with scalpel and carried through to the underlying layer of fascia. The fascia was incised in the midline and this incision was extended bilaterally using the Mayo scissors. Kocher clamps were applied to the superior aspect of the fascial incision and the underlying rectus muscles were dissected off bluntly. A similar process was carried out on the inferior aspect of the facial incision. The rectus muscles were separated in the midline bluntly and the peritoneum was entered bluntly. The Alexis self-retaining retractor was introduced into the abdominal cavity. Attention was turned to the lower uterine segment where a bladder flap was created, and a transverse hysterotomy was made with a scalpel and extended bilaterally bluntly. The infant was successfully delivered and delayed cord clamping was performed for 1 minute. The cord was clamped and cut and infant was handed over to awaiting neonatology team. Uterine massage was then administered and the placenta delivered intact with three-vessel cord. The uterus was cleared of clot and debris.  The hysterotomy was closed with 0 Vicryl in a running locked fashion, and an imbricating layer was also placed with a 0 Vicryl. Overall, excellent hemostasis was noted. The pelvis copiously irrigated and cleared of all clot and debris. Hemostasis was confirmed on all surfaces.  The peritoneum and the muscles were reapproximated using 0 vicryl interrupted stitches. The fascia was then closed using 0 Vicryl in a running fashion.  The subcutaneous layer was reapproximated with plain gut and the skin was closed in a subcuticular fashion using 3.0 Vicryl. The patient tolerated the procedure well. Sponge, lap, instrument and needle counts were correct x 2. She was taken to the recovery room in stable condition.    Aniello Christopoulos ConstantMD  08/03/2018 2:18 PM

## 2018-08-03 NOTE — Progress Notes (Signed)
[employer] used.  Iris [phone]

## 2018-08-03 NOTE — Progress Notes (Signed)
Nathaly Braylon Lemmons is a 33 y.o. G3P1011 at [redacted]w[redacted]d  admitted for PROM  Subjective: Patient is without complaints and comfortable with epidural  Objective: BP 104/72   Pulse 70   Temp 98.3 F (36.8 C) (Oral)   Resp 16   Wt 75.1 kg   SpO2 95%   BMI 32.32 kg/m  I/O last 3 completed shifts: In: -  Out: 250 [Urine:250] Total I/O In: -  Out: 50 [Urine:50]  FHT:  Baseline 135, min-mod variability, no accels, positive prolonged decels with good recovery following discontinuation of pitocin UC:   regular, every 3  minutes SVE:   Dilation: 5 Effacement (%): 100 Station: -1, 0 Exam by:: Rosana Hoes, RN  Labs: Lab Results  Component Value Date   WBC 10.7 (H) 08/02/2018   HGB 14.0 08/02/2018   HCT 43.3 08/02/2018   MCV 85.9 08/02/2018   PLT 223 08/02/2018    Assessment / Plan: Unable to proceed with labor augmentation with pitocin due to recurrent deceleration. Discussed delivery by cesarean section due to fetal intolerance of labor. Risks, benefits and alternatives were explained including but not limited to risks of bleeding, infection and damage to adjacent organs. Patient verbalized understanding and all questions were answered. Patient is planning POP for contraception   Jaishaun Mcnab 08/03/2018, 11:43 AM

## 2018-08-03 NOTE — Progress Notes (Signed)
Patient ID: Gabriela Willis, female   DOB: 1985-07-09, 33 y.o.   MRN: [id number] Gabriela Willis is a 33 y.o. G3P1011 at [redacted]w[redacted]d admitted for rupture of membranes/early labor  Subjective: Comfortable w/ epidural  Objective: BP 108/63   Pulse 79   Temp 98 F (36.7 C) (Oral)   Resp 16   Wt 75.1 kg   SpO2 95%   BMI 32.32 kg/m  No intake/output data recorded.  FHT:  FHR: 135 bpm, variability: moderate,  accelerations:  Present,  decelerations:  Absent UC:    q 3-64mins  SVE:   Dilation: 5 Effacement (%): 100 Station: -1 Exam by:: [employer] RN  Labs: Lab Results  Component Value Date   WBC 10.7 (H) 08/02/2018   HGB 14.0 08/02/2018   HCT 43.3 08/02/2018   MCV 85.9 08/02/2018   PLT 223 08/02/2018    Assessment / Plan: Spontaneous labor, progressing normally  Labor: Progressing normally Fetal Wellbeing:  Category I Pain Control:  Epidural Pre-eclampsia: n/a I/D:  GBS neg Anticipated MOD:  VBAC  Roma Schanz CNM, WHNP-BC 08/03/2018, 2:23 AM

## 2018-08-03 NOTE — Lactation Note (Signed)
This note was copied from a baby's chart. Lactation Consultation Note  Patient Name: Gabriela Willis [id number] Date: 08/03/2018 Reason for consult: Initial assessment;Term  P2 mother whose infant is now 2 hours old.  Mother breast fed her first child (now 31 years old) for 6 months.  Baby was asleep in mother's arms when I arrived.  Parents stated that he has been latching and feeding well since birth.  Encouraged to feed 8-12 times/24 hours or sooner if baby shows feeding cues.  Reviewed cues.  Suggested hand expression before/after feedings to help increase milk supply.  Colostrum container provided and milk storage times reviewed.  Finger feeding demonstrated.  Mother will be a "stay at home" mother after leave.  She does not have a DEBP for home use.  Mother is a [facility] participant and may obtain a pump from the [facility] office.    Mom made aware of O/P services, breastfeeding support groups, community resources, and our phone # for post-discharge questions.  Father present and supportive.  Encouraged mother to call her RN/LC for latch assistance as needed.   Maternal Data Formula Feeding for Exclusion: Yes Reason for exclusion: Mother's choice to formula and breast feed on admission Has patient been taught Hand Expression?: Yes Does the patient have breastfeeding experience prior to this delivery?: Yes  Feeding Feeding Type: Breast Fed  LATCH Score Latch: Grasps breast easily, tongue down, lips flanged, rhythmical sucking.  Audible Swallowing: A few with stimulation  Type of Nipple: Everted at rest and after stimulation  Comfort (Breast/Nipple): Soft / non-tender  Hold (Positioning): Assistance needed to correctly position infant at breast and maintain latch.  LATCH Score: 8  Interventions    Lactation Tools Discussed/Used WIC Program: Yes   Consult Status Consult Status: Follow-up Date: 08/04/18 Follow-up type: In-patient    Little Ishikawa 08/03/2018, 5:36  PM

## 2018-08-03 NOTE — Plan of Care (Signed)
  Problem: Education: Goal: Knowledge of Childbirth will improve Outcome: Progressing Goal: Ability to state and carry out methods to decrease the pain will improve Outcome: Progressing   Problem: Life Cycle: Goal: Ability to make normal progression through stages of labor will improve Outcome: Progressing   Problem: Safety: Goal: Risk of complications during labor and delivery will decrease Outcome: Progressing

## 2018-08-04 ENCOUNTER — Encounter (HOSPITAL_COMMUNITY): Payer: Self-pay | Admitting: Obstetrics and Gynecology

## 2018-08-04 LAB — CBC
HCT: 32.1 % — ABNORMAL LOW (ref 36.0–46.0)
Hemoglobin: 10.4 g/dL — ABNORMAL LOW (ref 12.0–15.0)
MCH: 27.8 pg (ref 26.0–34.0)
MCHC: 32.4 g/dL (ref 30.0–36.0)
MCV: 85.8 fL (ref 80.0–100.0)
Platelets: 161 10*3/uL (ref 150–400)
RBC: 3.74 MIL/uL — ABNORMAL LOW (ref 3.87–5.11)
RDW: 15.5 % (ref 11.5–15.5)
WBC: 13.3 10*3/uL — ABNORMAL HIGH (ref 4.0–10.5)
nRBC: 0 % (ref 0.0–0.2)

## 2018-08-04 MED ORDER — IBUPROFEN 800 MG PO TABS: 800.0000 mg | ORAL_TABLET | Freq: Three times a day (TID) | ORAL | 0 refills | Status: AC

## 2018-08-04 NOTE — Lactation Note (Signed)
This note was copied from a baby's chart. Lactation Consultation Note  Patient Name: Boy Kionna Brier [id number] Date: 08/04/2018 Reason for consult: Follow-up assessment;Early term 72-38.6wks  P2 mother whose infant is now 84 hours old.  Mother breast fed her first child (now 31 years old) for 6 months.  RN in room when I arrived.  Mother had just finished breast feeding baby.  Per RN, mother was asking about giving formula.  RN felt like this was not necessary and I agreed.  Encouraged mother to continue feeding often to help increase milk supply.  Her breasts are soft and non tender and nipples are everted and intact.  RN had just changed a full wet diaper.    Mother is content at this time to breast feed only.  I will continue to encourage mother to breastfeed instead of providing formula.  Mother will call for latch assistance as needed.  Father present.   Maternal Data Formula Feeding for Exclusion: Yes Reason for exclusion: Mother's choice to formula and breast feed on admission Has patient been taught Hand Expression?: Yes Does the patient have breastfeeding experience prior to this delivery?: Yes  Feeding Feeding Type: Breast Fed  LATCH Score                   Interventions    Lactation Tools Discussed/Used     Consult Status Consult Status: Follow-up Date: 08/05/18 Follow-up type: In-patient    Danne Vasek R Wei Newbrough 08/04/2018, 2:27 PM

## 2018-08-04 NOTE — Progress Notes (Addendum)
POSTPARTUM PROGRESS NOTE  POD #1  Subjective:  Gabriela Willis is a 33 y.o. [id number] s/p LTCS at [redacted]w[redacted]d.  She reports she doing well. No acute events overnight. She reports she is doing well. She denies any problems with ambulating, voiding or po intake. Denies nausea or vomiting. She has not passed flatus. Pain is well controlled.  Lochia is appropriate.  Objective: Blood pressure (!) 105/54, pulse 84, temperature 98.5 F (36.9 C), temperature source Oral, resp. rate 20, weight 75.1 kg, SpO2 97 %, unknown if currently breastfeeding.  Physical Exam:  General: alert, cooperative and no distress Chest: no respiratory distress Heart: distal pulses intact Abdomen: soft, nontender,  Uterine Fundus: firm, appropriately tender DVT Evaluation: No calf swelling or tenderness Extremities: No edema Skin: warm, dry; incision clean/dry/intact w/ honeycomb dressing in place  Recent Labs    08/03/18 1641 08/04/18 0535  HGB 11.9* 10.4*  HCT 35.7* 32.1*    Assessment/Plan: Gabriela Willis is a 33 y.o. [id number] s/p LTCS at [redacted]w[redacted]d for NRFHR, PROM, TOLAC.  POD#1  - Doing welll; pain is controlled. H/H appropriate  Routine postpartum care  OOB, ambulated  Lovenox for VTE prophylaxis Anemia: asymptomatic  Start po ferrous sulfate BID Contraception: POPs Feeding: Breast and bottle  Dispo: Plan for discharge home.   LOS: 2 days   Gerlene Fee, D.O. Family Medicine Resident, PGY-1 08/04/2018, 8:39 AM   I confirm that I have verified the information documented in the resident's note and that I have also personally reperformed the physical exam and all medical decision making activities. Patient was seen and examined by me also Agree with note Vitals stable Labs stable Fundus firm, lochia within normal limits Dressing clean and intact Ext WNL Continue care Anticipate discharge tomorrow  Seabron Spates, CNM

## 2018-08-05 DIAGNOSIS — O4290 Premature rupture of membranes, unspecified as to length of time between rupture and onset of labor, unspecified weeks of gestation: Secondary | ICD-10-CM | POA: Diagnosis present

## 2018-08-05 DIAGNOSIS — Z98891 History of uterine scar from previous surgery: Secondary | ICD-10-CM

## 2018-08-05 MED ORDER — OXYCODONE-ACETAMINOPHEN 5-325 MG PO TABS: 1.0000 | ORAL_TABLET | ORAL | 0 refills | Status: AC | PRN

## 2018-08-05 NOTE — Lactation Note (Signed)
This note was copied from a baby's chart. Lactation Consultation Note  Patient Name: Gabriela Willis [id number] Date: 08/05/2018 Reason for consult: Follow-up assessment;Infant weight loss;Term  40 hours old FT female who is being partially BF and formula fed by his mother, she's a P2. Mom and baby are going home today, baby is at 8% weight loss, but parents already supplementing with Gerber Gentle. Noticed that the last formula feeding was of 46 ml, revised formula supplementation guidelines when feeding baby at the breast, parents voiced understanding.  Reviewed discharge instructions, engorgement prevention and treatment, treatment/prevention for sore nipples. Mom's breast are still soft and both of her nipples looked everted and intact upon examination, she only complained on some milk discomfort, advised to work on getting a deep latch (covering all the areola area, not just the nipple) when baby is feeding at the breast. Encouraged to keep feeding baby on cues 8-12 times/24 hours and to revised feeding plan with pediatrician on his next appointment.  Parents reported all questions and concerns were answered, they're both aware of [place] OP services and will contact PRN.  Maternal Data    Feeding Feeding Type: Breast Fed   Interventions Interventions: Breast feeding basics reviewed  Lactation Tools Discussed/Used     Consult Status Consult Status: Complete Date: 08/05/18 Follow-up type: In-patient    Gabriela Willis 08/05/2018, 4:11 PM

## 2018-08-05 NOTE — Discharge Instructions (Signed)
Parto por cesrea, cuidados posteriores  Cesarean Delivery, Care After  Esta hoja le brinda informacin sobre cmo cuidarse despus del procedimiento. El mdico tambin podr darle indicaciones ms especficas. Comunquese con el mdico si tiene problemas o preguntas.  Qu puedo esperar despus del procedimiento?  Despus del procedimiento, es comn tener los siguientes sntomas:   Una pequea cantidad de sangre o de lquido transparente que proviene de la incisin.   Enrojecimiento, hinchazn y dolor en la zona de la incisin.   Dolor y molestia abdominales.   Hemorragia vaginal (loquios). Aunque no haya tenido parto vaginal, tendr sangrado y secrecin vaginal.   Calambres plvicos.   Fatiga.  Es posible que sienta dolor, hinchazn y molestias en el tejido que se encuentra entre la vagina y el ano (perineo) en los siguientes casos:   Si la cesrea no fue planificada y le permitieron realizar el trabajo de parto y pujar.   Si le hicieron una incisin en la zona (episiotoma) o el tejido se desgarr durante el intento de parto vaginal.  Siga estas indicaciones en su casa:  Cuidados de la incisin     Siga las indicaciones del mdico acerca del cuidado de la incisin. Asegrese de hacer lo siguiente:  ? Lvese las manos con agua y jabn antes de cambiar las vendas (vendaje). Use desinfectante para manos si no dispone de agua y jabn.  ? Si tiene un vendaje, cmbielo o quteselo siguiendo las indicaciones del mdico.  ? No retire los puntos (suturas), las grapas cutneas, la goma para cerrar la piel o las tiras adhesivas. Es posible que estos cierres cutneos deban permanecer en la piel durante 2semanas o ms tiempo. Si los bordes de las tiras adhesivas empiezan a despegarse y enroscarse, puede recortar los que estn sueltos. No retire las tiras adhesivas por completo a menos que el mdico se lo indique.   Controle todos los das la zona de la incisin para detectar signos de infeccin. Est atento a los  siguientes signos:  ? Aumento del enrojecimiento, la hinchazn o el dolor.  ? Ms lquido o sangre.  ? Calor.  ? Pus o mal olor.   No tome baos de inmersin, no nade ni use un jacuzzi hasta que el mdico la autorice. Pregntele al mdico si puede ducharse.   Cuando tosa o estornude, abrace una almohada. Esto ayuda con el dolor y disminuye la posibilidad de que su incisin se abra (dehiscencia). Haga esto hasta que la incisin cicatrice.  Medicamentos   Tome los medicamentos de venta libre y los recetados solamente como se lo haya indicado el mdico.   Si le recetaron un antibitico, tmelo como se lo haya indicado el mdico. No deje de tomar el antibitico aunque comience a sentirse mejor.   No conduzca ni use maquinaria pesada mientras toma analgsicos recetados.  Estilo de vida   No beba alcohol. Esto es de suma importancia si est amamantando o toma analgsicos.   No consuma ningn producto que contenga nicotina o tabaco, como cigarrillos, cigarrillos electrnicos y tabaco de mascar. Si necesita ayuda para dejar de fumar, consulte al mdico.  Comida y bebida   Beba al menos 8vasos de ochoonzas (240cc) de agua todos los das a menos que el mdico le indique lo contrario. Si amamanta, quiz deba beber an ms cantidad de agua.   Coma alimentos ricos en fibras todos los das. Estos alimentos pueden ayudar a prevenir o aliviar el estreimiento. Los alimentos ricos en fibra incluyen los siguientes:  ?   menos algunos das despus de que le den el alta del hospital.  Retome sus actividades normales como se lo haya indicado el mdico. Pregntele al mdico qu actividades son seguras para usted.  Descanse todo lo que pueda. Trate de descansar o tomar una siesta mientras el beb duerme.  No levante  ningn objeto que pese ms de 10libras (4,5kg) o el lmite de peso que le hayan indicado, hasta que el mdico le diga que puede hacerlo.  Hable con el mdico sobre cundo puede retomar la actividad sexual. Esto puede depender de lo siguiente: ? Riesgo de sufrir una infeccin. ? La rapidez con que cicatrice. ? Comodidad y deseo de retomar la actividad sexual. Indicaciones generales  No use tampones ni se haga duchas vaginales hasta que el mdico la autorice.  Use ropa floja y cmoda y un sostn firme y que le calce bien.  Mantenga el perineo limpio y seco. Cuando vaya al bao, siempre higiencese de adelante hacia atrs.  Si expulsa un cogulo de sangre, gurdelo y llame al mdico para informrselo. No deseche los cogulos de sangre por el inodoro antes de recibir indicaciones del mdico.  Concurra a todas las visitas de seguimiento para usted y el beb, como se lo haya indicado el mdico. Esto es importante. Comunquese con un mdico si:  Tiene los siguientes sntomas: ? Fiebre. ? Secrecin vaginal con mal olor. ? Pus o mal olor en el lugar de la incisin. ? Dificultad o dolor al orinar. ? Aumento o disminucin repentinos de la frecuencia de las deposiciones. ? Aumento del enrojecimiento, la hinchazn o el dolor alrededor de la incisin. ? Aumento del lquido o sangre proveniente de la incisin. ? Erupcin cutnea. ? Nuseas. ? Poco inters o falta de inters en actividades que solan gustarle. ? Dudas sobre su cuidado y el del beb.  Su incisin se siente caliente al tacto.  Siente dolor en las mamas y se ponen rojas o duras.  Siente tristeza o preocupacin de forma inusual.  Vomita.  Elimina un cogulo de sangre grande por la vagina.  Orina ms de lo habitual.  Se siente mareado o aturdido. Solicite ayuda inmediatamente si:  Tiene los siguientes sntomas: ? Dolor que no desaparece o no mejora con medicamentos. ? Dolor en el pecho. ? Dificultad para respirar. ?  Visin borrosa o manchas en la visin. ? Pensamientos de autolesionarse o lesionar al beb. ? Nuevo dolor en el abdomen o en una de las piernas. ? Dolor de cabeza intenso.  Se desmaya.  Tiene una hemorragia tan intensa en la vagina que empapa ms de un apsito en una hora. El sangrado no debe ser ms abundante que el perodo ms intenso que haya tenido. Resumen  Despus del procedimiento, es comn tener dolor en el lugar de la incisin, clicos abdominales, y sangrado vaginal leve.  Controle todos los das la zona de la incisin para detectar signos de infeccin.  Informe al mdico sobre cualquier sntoma inusual.  Concurra a todas las visitas de seguimiento para usted y el beb, como se lo haya indicado el mdico. Esta informacin no tiene como fin reemplazar el consejo del mdico. Asegrese de hacerle al mdico cualquier pregunta que tenga. Document Released: 01/05/2005 Document Revised: 08/18/2017 Document Reviewed: 08/18/2017 Elsevier Patient Education  2020 [employer]   

## 2018-08-05 NOTE — Progress Notes (Signed)
POSTPARTUM PROGRESS NOTE  POD #2  Subjective:  Gabriela Willis is a 33 y.o. [id number] s/p LTCS at [redacted]w[redacted]d.  She reports she doing well. No acute events overnight. She reports she is doing well. She denies any problems with ambulating, voiding or po intake. Denies nausea or vomiting. She has passed flatus. Pain is well controlled. 2/10. Lochia is appropriate.  Objective: Blood pressure (!) 87/54, pulse 68, temperature 97.9 F (36.6 C), temperature source Oral, resp. rate 16, height 5' (1.524 m), weight 75.1 kg, SpO2 98 %, unknown if currently breastfeeding.  Physical Exam:  General: alert, cooperative and no distress Chest: no respiratory distress Heart: distal pulses intact Abdomen: soft, nontender Uterine Fundus: firm, appropriately tender DVT Evaluation: No calf swelling or tenderness Extremities: No edema Skin: warm, dry; incision clean/dry/intact w/ honeycomb dressing in place  Recent Labs    08/03/18 1641 08/04/18 0535  HGB 11.9* 10.4*  HCT 35.7* 32.1*    Assessment/Plan: Gabriela Willis is a 33 y.o. [id number] s/p LTCSW at [redacted]w[redacted]d for NRFHR and PROM.  POD#2 - Doing welll; pain well controlled. H/H appropriate  Routine postpartum care  OOB, ambulated  Lovenox for VTE prophylaxis Anemia: asymptomatic  Start po ferrous sulfate BID Contraception: POPs Feeding: Breast and bottle  Dispo: Plan for discharge home 08/06/18.   LOS: 3 days   Gerlene Fee, D.O. Family Medicine Resident, PGY-1 08/05/2018, 7:39 AM

## 2018-08-05 NOTE — Discharge Summary (Signed)
OB Discharge Summary     Patient Name: Gabriela Willis DOB: 1985/10/22 MRN: [id number]  Date of admission: 08/02/2018 Delivering MD: Catalina AntiguaONSTANT, PEGGY   Date of discharge: 08/05/2018  Admitting diagnosis: [place] Broke  CTX 5 MIN  Intrauterine pregnancy: [id number]     Secondary diagnosis:  Active Problems:   Previous cesarean section   Amniotic fluid leaking  Additional problems: GBS neg     Discharge diagnosis: Term Pregnancy Delivered and failed TOLAC                                                                                                Post partum procedures:none  Augmentation: AROM and Pitocin  Complications: None  Hospital course:  Onset of Labor With Unplanned C/S  33 y.o. yo [id number] at 888w0d was admitted in Latent Labor on 08/02/2018. Patient had a labor course significant for coming in with PROM and desirous of TOLAC. Her labor course consisted of SROM of a forebag, epidural placement, and attempted Pitocin augmentation with poor fetal tolerance. Membrane Rupture Time/Date: 5:00 PM ,08/02/2018   The patient went for cesarean section due to Non-Reassuring FHR, and delivered a Viable infant,08/03/2018  Details of operation can be found in separate operative note. Patient had an uncomplicated postpartum course. On POD#1 her Hgb was 10.4 (was 11.9 preop). She is ambulating,tolerating a regular diet, passing flatus, and urinating well.  Patient is discharged home in stable condition 07/17/20per her request for early discharge if baby is stable to go.  Physical exam  Vitals:   08/04/18 1500 08/04/18 2210 08/05/18 0545 08/05/18 1352  BP:  106/68 (!) 87/54 (!) 86/72  Pulse:  65 68 62  Resp:  18 16 16   Temp:  97.9 F (36.6 C) 97.9 F (36.6 C) 98.3 F (36.8 C)  TempSrc:  Axillary Oral Oral  SpO2:   98% 100%  Weight: 75.1 kg     Height: 5' (1.524 m)      General: alert and cooperative Lochia: appropriate Uterine Fundus: firm Incision: honeycomb intact, dry DVT  Evaluation: No evidence of DVT seen on physical exam. Labs: Lab Results  Component Value Date   WBC 13.3 (H) 08/04/2018   HGB 10.4 (L) 08/04/2018   HCT 32.1 (L) 08/04/2018   MCV 85.8 08/04/2018   PLT 161 08/04/2018   CMP Latest Ref Rng & Units 08/03/2018  Creatinine 0.44 - 1.00 mg/dL 3.240.68    Discharge instruction: per After Visit Summary and "Baby and Me Booklet".  After visit meds:  Allergies as of 08/05/2018      Reactions   Penicillins Hives      Medication List    TAKE these medications   ibuprofen 800 MG tablet Commonly known as: ADVIL Take 1 tablet (800 mg total) by mouth every 8 (eight) hours.   oxyCODONE-acetaminophen 5-325 MG tablet Commonly known as: PERCOCET/ROXICET Take 1-2 tablets by mouth every 4 (four) hours as needed for moderate pain.   prenatal multivitamin Tabs tablet Take 1 tablet by mouth at bedtime.       Diet: routine diet  Activity: Advance as tolerated. Pelvic  rest for 6 weeks.   Outpatient follow WL:[id number] check in 1-2 weeks; PP visit in 4-6 weeks Follow up Appt:No future appointments. Follow up Visit:No follow-ups on file.  Postpartum contraception: Combination OCPs  Newborn Data: Live born female  Birth Weight: 7 lb 13.8 oz (3565 g) APGAR: 9, 9  Newborn Delivery   Birth date/time: 08/03/2018 13:47:00 Delivery type: C-Section, Low Transverse Trial of labor: Yes C-section categorization: Repeat      Baby Feeding: Bottle and Breast Disposition:home with mother   08/05/2018 Myrtis Ser, CNM  3:50 PM

## 2018-09-05 ENCOUNTER — Other Ambulatory Visit: Payer: Self-pay

## 2018-09-05 ENCOUNTER — Ambulatory Visit (INDEPENDENT_AMBULATORY_CARE_PROVIDER_SITE_OTHER): Payer: Self-pay | Admitting: Obstetrics & Gynecology

## 2018-09-05 VITALS — BP 101/63 | HR 74 | Temp 98.6°F

## 2018-09-05 DIAGNOSIS — Z4889 Encounter for other specified surgical aftercare: Secondary | ICD-10-CM

## 2018-09-05 NOTE — Progress Notes (Signed)
Subjective:     Gabriela Willis is a 33 y.o.  [phone] female who presents to the clinic 4 weeks status post cesarean section.  Patient is Spanish-speaking only, Spanish interpreter present for this encounter. She reports having a lot of bloody drainage from right side of her incision and some pain. No fevers or other systemic symptoms.   The following portions of the patient's history were reviewed and updated as appropriate: allergies, current medications, past family history, past medical history, past social history, past surgical history and problem list.  Review of Systems Pertinent items noted in HPI and remainder of comprehensive ROS otherwise negative.    Objective:    BP 101/63   Pulse 74   Temp 98.6 F (37 C)   Breastfeeding No Comment: Breast and bottlefeeding General:  alert and no distress  Abdomen: soft, bowel sounds active, non-tender  Incision:   5 mm superficial separation of wound on right side of incision, no active drainage, no erythema, no hernia, no seroma, no swelling. Treated with silver nitrate     Assessment and Plan:     1. Encounter for postoperative wound check Superficial separation noted, no signs of infection. S/p silver nitrate. Follow up in one week.     Verita Schneiders, MD, [place] for [employer], [place]

## 2018-09-05 NOTE — Progress Notes (Signed)
Had repeat c/s 08/03/18. Noticed R side of incision has small opening and draining cl fld for couple wks. Pain is "throbbing" like in that area but does not require pain med. Drainage has funny odor. Derenda Mis in person Spanish interpreter

## 2018-09-13 ENCOUNTER — Telehealth: Payer: Self-pay | Admitting: Family Medicine

## 2018-09-13 NOTE — Telephone Encounter (Signed)
Spanish interpreter Eda spoke with patient about her appointment on 8/26 @ 10:00. Patient instructed to wear a face mask and no visitors are allowed during the visit. Patient screened for covid symptoms and denied having any.

## 2018-09-14 ENCOUNTER — Ambulatory Visit: Payer: Self-pay
